# Patient Record
Sex: Female | Born: 1950 | Race: Black or African American | Hispanic: No | Marital: Single
Health system: Southern US, Community
[De-identification: ages and names within clinical notes are randomized; demographics above are authoritative.]

## PROBLEM LIST (undated history)

## (undated) HISTORY — PX: BREAST BIOPSY: SHX20

---

## 2000-01-05 ENCOUNTER — Encounter: Payer: Self-pay | Admitting: Family Medicine

## 2000-01-05 ENCOUNTER — Encounter: Admission: RE | Admit: 2000-01-05 | Discharge: 2000-01-05 | Payer: Self-pay | Admitting: Family Medicine

## 2000-05-15 ENCOUNTER — Other Ambulatory Visit: Admission: RE | Admit: 2000-05-15 | Discharge: 2000-05-15 | Payer: Self-pay | Admitting: Family Medicine

## 2001-01-05 ENCOUNTER — Encounter: Admission: RE | Admit: 2001-01-05 | Discharge: 2001-01-05 | Payer: Self-pay | Admitting: Family Medicine

## 2001-01-05 ENCOUNTER — Encounter: Payer: Self-pay | Admitting: Family Medicine

## 2002-07-03 ENCOUNTER — Encounter: Payer: Self-pay | Admitting: Family Medicine

## 2002-07-03 ENCOUNTER — Encounter: Admission: RE | Admit: 2002-07-03 | Discharge: 2002-07-03 | Payer: Self-pay | Admitting: Family Medicine

## 2003-08-06 ENCOUNTER — Ambulatory Visit (HOSPITAL_COMMUNITY): Admission: RE | Admit: 2003-08-06 | Discharge: 2003-08-06 | Payer: Self-pay | Admitting: Family Medicine

## 2003-08-08 ENCOUNTER — Encounter: Admission: RE | Admit: 2003-08-08 | Discharge: 2003-08-08 | Payer: Self-pay | Admitting: Family Medicine

## 2003-08-27 ENCOUNTER — Other Ambulatory Visit: Admission: RE | Admit: 2003-08-27 | Discharge: 2003-08-27 | Payer: Self-pay | Admitting: Family Medicine

## 2004-09-07 ENCOUNTER — Ambulatory Visit (HOSPITAL_COMMUNITY): Admission: RE | Admit: 2004-09-07 | Discharge: 2004-09-07 | Payer: Self-pay | Admitting: Family Medicine

## 2006-11-06 ENCOUNTER — Other Ambulatory Visit: Admission: RE | Admit: 2006-11-06 | Discharge: 2006-11-06 | Payer: Self-pay | Admitting: Family Medicine

## 2007-08-02 ENCOUNTER — Encounter: Admission: RE | Admit: 2007-08-02 | Discharge: 2007-08-02 | Payer: Self-pay | Admitting: Family Medicine

## 2007-12-13 ENCOUNTER — Encounter: Admission: RE | Admit: 2007-12-13 | Discharge: 2007-12-13 | Payer: Self-pay | Admitting: Family Medicine

## 2009-01-30 ENCOUNTER — Encounter: Admission: RE | Admit: 2009-01-30 | Discharge: 2009-01-30 | Payer: Self-pay | Admitting: Family Medicine

## 2009-02-09 ENCOUNTER — Encounter: Admission: RE | Admit: 2009-02-09 | Discharge: 2009-02-09 | Payer: Self-pay | Admitting: Family Medicine

## 2009-05-13 ENCOUNTER — Encounter: Admission: RE | Admit: 2009-05-13 | Discharge: 2009-05-13 | Payer: Self-pay | Admitting: Family Medicine

## 2010-05-17 ENCOUNTER — Encounter: Admission: RE | Admit: 2010-05-17 | Discharge: 2010-05-17 | Payer: Self-pay | Admitting: Family Medicine

## 2010-06-16 ENCOUNTER — Encounter
Admission: RE | Admit: 2010-06-16 | Discharge: 2010-06-16 | Payer: Self-pay | Source: Home / Self Care | Attending: Oral Surgery | Admitting: Oral Surgery

## 2011-08-04 ENCOUNTER — Other Ambulatory Visit: Payer: Self-pay | Admitting: Family Medicine

## 2011-08-04 DIAGNOSIS — Z1231 Encounter for screening mammogram for malignant neoplasm of breast: Secondary | ICD-10-CM

## 2011-08-11 ENCOUNTER — Ambulatory Visit
Admission: RE | Admit: 2011-08-11 | Discharge: 2011-08-11 | Disposition: A | Discharge status: Home / Self Care | Payer: Federal, State, Local not specified - PPO | Source: Ambulatory Visit | Attending: Family Medicine | Admitting: Family Medicine

## 2011-08-11 DIAGNOSIS — Z1231 Encounter for screening mammogram for malignant neoplasm of breast: Secondary | ICD-10-CM

## 2012-08-29 ENCOUNTER — Other Ambulatory Visit: Payer: Self-pay

## 2012-08-29 DIAGNOSIS — Z1231 Encounter for screening mammogram for malignant neoplasm of breast: Secondary | ICD-10-CM

## 2012-09-24 ENCOUNTER — Ambulatory Visit
Admission: RE | Admit: 2012-09-24 | Discharge: 2012-09-24 | Disposition: A | Discharge status: Home / Self Care | Payer: Federal, State, Local not specified - PPO | Source: Ambulatory Visit

## 2012-09-24 DIAGNOSIS — Z1231 Encounter for screening mammogram for malignant neoplasm of breast: Secondary | ICD-10-CM

## 2014-02-25 ENCOUNTER — Other Ambulatory Visit: Payer: Self-pay

## 2014-02-25 DIAGNOSIS — Z1231 Encounter for screening mammogram for malignant neoplasm of breast: Secondary | ICD-10-CM

## 2014-03-11 ENCOUNTER — Ambulatory Visit
Admission: RE | Admit: 2014-03-11 | Discharge: 2014-03-11 | Disposition: A | Discharge status: Home / Self Care | Payer: Federal, State, Local not specified - PPO | Source: Ambulatory Visit

## 2014-03-11 DIAGNOSIS — Z1231 Encounter for screening mammogram for malignant neoplasm of breast: Secondary | ICD-10-CM

## 2015-03-12 ENCOUNTER — Other Ambulatory Visit: Payer: Self-pay | Admitting: Family Medicine

## 2015-03-12 DIAGNOSIS — N6321 Unspecified lump in the left breast, upper outer quadrant: Secondary | ICD-10-CM

## 2015-03-12 DIAGNOSIS — N644 Mastodynia: Secondary | ICD-10-CM

## 2015-03-19 ENCOUNTER — Ambulatory Visit
Admission: RE | Admit: 2015-03-19 | Discharge: 2015-03-19 | Disposition: A | Discharge status: Home / Self Care | Payer: Federal, State, Local not specified - PPO | Source: Ambulatory Visit | Attending: Family Medicine | Admitting: Family Medicine

## 2015-03-19 ENCOUNTER — Other Ambulatory Visit: Payer: Self-pay | Admitting: Family Medicine

## 2015-03-19 DIAGNOSIS — N6320 Unspecified lump in the left breast, unspecified quadrant: Secondary | ICD-10-CM

## 2015-03-19 DIAGNOSIS — N6321 Unspecified lump in the left breast, upper outer quadrant: Secondary | ICD-10-CM

## 2015-03-19 DIAGNOSIS — N644 Mastodynia: Secondary | ICD-10-CM

## 2015-03-25 ENCOUNTER — Other Ambulatory Visit: Payer: Self-pay | Admitting: Family Medicine

## 2015-03-25 DIAGNOSIS — N6320 Unspecified lump in the left breast, unspecified quadrant: Secondary | ICD-10-CM

## 2015-03-25 DIAGNOSIS — N644 Mastodynia: Secondary | ICD-10-CM

## 2015-03-27 ENCOUNTER — Ambulatory Visit
Admission: RE | Admit: 2015-03-27 | Discharge: 2015-03-27 | Disposition: A | Discharge status: Home / Self Care | Payer: Federal, State, Local not specified - PPO | Source: Ambulatory Visit | Attending: Family Medicine | Admitting: Family Medicine

## 2015-03-27 DIAGNOSIS — N6320 Unspecified lump in the left breast, unspecified quadrant: Secondary | ICD-10-CM

## 2015-03-27 DIAGNOSIS — N644 Mastodynia: Secondary | ICD-10-CM

## 2015-09-08 DIAGNOSIS — I10 Essential (primary) hypertension: Secondary | ICD-10-CM | POA: Diagnosis not present

## 2015-09-08 DIAGNOSIS — Z23 Encounter for immunization: Secondary | ICD-10-CM | POA: Diagnosis not present

## 2015-09-08 DIAGNOSIS — J309 Allergic rhinitis, unspecified: Secondary | ICD-10-CM | POA: Diagnosis not present

## 2015-09-09 ENCOUNTER — Other Ambulatory Visit: Payer: Self-pay | Admitting: Family Medicine

## 2015-09-09 DIAGNOSIS — N6320 Unspecified lump in the left breast, unspecified quadrant: Secondary | ICD-10-CM

## 2015-09-28 ENCOUNTER — Ambulatory Visit
Admission: RE | Admit: 2015-09-28 | Discharge: 2015-09-28 | Disposition: A | Discharge status: Home / Self Care | Payer: Medicare Other | Source: Ambulatory Visit | Attending: Family Medicine | Admitting: Family Medicine

## 2015-09-28 DIAGNOSIS — N6320 Unspecified lump in the left breast, unspecified quadrant: Secondary | ICD-10-CM

## 2015-09-28 DIAGNOSIS — N6489 Other specified disorders of breast: Secondary | ICD-10-CM | POA: Diagnosis not present

## 2015-12-29 DIAGNOSIS — H04123 Dry eye syndrome of bilateral lacrimal glands: Secondary | ICD-10-CM | POA: Diagnosis not present

## 2016-02-01 DIAGNOSIS — F4323 Adjustment disorder with mixed anxiety and depressed mood: Secondary | ICD-10-CM | POA: Diagnosis not present

## 2016-03-18 ENCOUNTER — Other Ambulatory Visit: Payer: Self-pay | Admitting: Family Medicine

## 2016-03-18 DIAGNOSIS — N63 Unspecified lump in unspecified breast: Secondary | ICD-10-CM

## 2016-03-30 ENCOUNTER — Ambulatory Visit
Admission: RE | Admit: 2016-03-30 | Discharge: 2016-03-30 | Disposition: A | Discharge status: Home / Self Care | Payer: Medicare Other | Source: Ambulatory Visit | Attending: Family Medicine | Admitting: Family Medicine

## 2016-03-30 DIAGNOSIS — R928 Other abnormal and inconclusive findings on diagnostic imaging of breast: Secondary | ICD-10-CM | POA: Diagnosis not present

## 2016-03-30 DIAGNOSIS — N63 Unspecified lump in unspecified breast: Secondary | ICD-10-CM

## 2016-04-05 DIAGNOSIS — J309 Allergic rhinitis, unspecified: Secondary | ICD-10-CM | POA: Diagnosis not present

## 2016-04-05 DIAGNOSIS — Z0000 Encounter for general adult medical examination without abnormal findings: Secondary | ICD-10-CM | POA: Diagnosis not present

## 2016-04-05 DIAGNOSIS — Z780 Asymptomatic menopausal state: Secondary | ICD-10-CM | POA: Diagnosis not present

## 2016-04-05 DIAGNOSIS — I10 Essential (primary) hypertension: Secondary | ICD-10-CM | POA: Diagnosis not present

## 2016-04-05 DIAGNOSIS — E663 Overweight: Secondary | ICD-10-CM | POA: Diagnosis not present

## 2016-04-05 DIAGNOSIS — H6121 Impacted cerumen, right ear: Secondary | ICD-10-CM | POA: Diagnosis not present

## 2016-04-05 DIAGNOSIS — Z1159 Encounter for screening for other viral diseases: Secondary | ICD-10-CM | POA: Diagnosis not present

## 2016-05-05 DIAGNOSIS — E2839 Other primary ovarian failure: Secondary | ICD-10-CM | POA: Diagnosis not present

## 2016-11-10 DIAGNOSIS — K649 Unspecified hemorrhoids: Secondary | ICD-10-CM | POA: Diagnosis not present

## 2017-05-02 DIAGNOSIS — I10 Essential (primary) hypertension: Secondary | ICD-10-CM | POA: Diagnosis not present

## 2017-05-02 DIAGNOSIS — Z0000 Encounter for general adult medical examination without abnormal findings: Secondary | ICD-10-CM | POA: Diagnosis not present

## 2017-05-02 DIAGNOSIS — Z23 Encounter for immunization: Secondary | ICD-10-CM | POA: Diagnosis not present

## 2017-05-02 DIAGNOSIS — E785 Hyperlipidemia, unspecified: Secondary | ICD-10-CM | POA: Diagnosis not present

## 2017-05-02 DIAGNOSIS — Z1211 Encounter for screening for malignant neoplasm of colon: Secondary | ICD-10-CM | POA: Diagnosis not present

## 2017-05-02 DIAGNOSIS — J309 Allergic rhinitis, unspecified: Secondary | ICD-10-CM | POA: Diagnosis not present

## 2017-05-02 DIAGNOSIS — E663 Overweight: Secondary | ICD-10-CM | POA: Diagnosis not present

## 2017-05-02 DIAGNOSIS — M545 Low back pain: Secondary | ICD-10-CM | POA: Diagnosis not present

## 2017-05-08 ENCOUNTER — Other Ambulatory Visit: Payer: Self-pay | Admitting: Family Medicine

## 2017-05-08 DIAGNOSIS — Z139 Encounter for screening, unspecified: Secondary | ICD-10-CM

## 2017-05-15 ENCOUNTER — Other Ambulatory Visit: Payer: Self-pay | Admitting: Family Medicine

## 2017-05-15 ENCOUNTER — Ambulatory Visit
Admission: RE | Admit: 2017-05-15 | Discharge: 2017-05-15 | Disposition: A | Discharge status: Home / Self Care | Payer: Medicare Other | Source: Ambulatory Visit | Attending: Family Medicine | Admitting: Family Medicine

## 2017-05-15 DIAGNOSIS — M5489 Other dorsalgia: Secondary | ICD-10-CM

## 2017-05-15 DIAGNOSIS — M533 Sacrococcygeal disorders, not elsewhere classified: Secondary | ICD-10-CM | POA: Diagnosis not present

## 2017-05-15 DIAGNOSIS — M47816 Spondylosis without myelopathy or radiculopathy, lumbar region: Secondary | ICD-10-CM | POA: Diagnosis not present

## 2017-05-15 DIAGNOSIS — R102 Pelvic and perineal pain: Secondary | ICD-10-CM | POA: Diagnosis not present

## 2017-06-07 ENCOUNTER — Ambulatory Visit
Admission: RE | Admit: 2017-06-07 | Discharge: 2017-06-07 | Disposition: A | Discharge status: Home / Self Care | Payer: Medicare Other | Source: Ambulatory Visit | Attending: Family Medicine | Admitting: Family Medicine

## 2017-06-07 ENCOUNTER — Encounter: Payer: Self-pay | Admitting: Radiology

## 2017-06-07 DIAGNOSIS — Z139 Encounter for screening, unspecified: Secondary | ICD-10-CM

## 2017-06-07 DIAGNOSIS — Z1231 Encounter for screening mammogram for malignant neoplasm of breast: Secondary | ICD-10-CM | POA: Diagnosis not present

## 2017-06-16 DIAGNOSIS — H2513 Age-related nuclear cataract, bilateral: Secondary | ICD-10-CM | POA: Diagnosis not present

## 2017-06-16 DIAGNOSIS — M545 Low back pain: Secondary | ICD-10-CM | POA: Diagnosis not present

## 2017-06-22 DIAGNOSIS — M545 Low back pain: Secondary | ICD-10-CM | POA: Diagnosis not present

## 2017-06-23 DIAGNOSIS — M545 Low back pain: Secondary | ICD-10-CM | POA: Diagnosis not present

## 2017-06-26 DIAGNOSIS — M545 Low back pain: Secondary | ICD-10-CM | POA: Diagnosis not present

## 2017-06-30 DIAGNOSIS — M545 Low back pain: Secondary | ICD-10-CM | POA: Diagnosis not present

## 2017-07-05 DIAGNOSIS — M545 Low back pain: Secondary | ICD-10-CM | POA: Diagnosis not present

## 2017-07-07 DIAGNOSIS — M545 Low back pain: Secondary | ICD-10-CM | POA: Diagnosis not present

## 2017-07-12 DIAGNOSIS — M545 Low back pain: Secondary | ICD-10-CM | POA: Diagnosis not present

## 2017-07-14 DIAGNOSIS — M545 Low back pain: Secondary | ICD-10-CM | POA: Diagnosis not present

## 2017-07-19 DIAGNOSIS — M545 Low back pain: Secondary | ICD-10-CM | POA: Diagnosis not present

## 2017-07-21 DIAGNOSIS — M545 Low back pain: Secondary | ICD-10-CM | POA: Diagnosis not present

## 2017-07-25 DIAGNOSIS — Z1211 Encounter for screening for malignant neoplasm of colon: Secondary | ICD-10-CM | POA: Diagnosis not present

## 2017-07-25 DIAGNOSIS — K5730 Diverticulosis of large intestine without perforation or abscess without bleeding: Secondary | ICD-10-CM | POA: Diagnosis not present

## 2017-07-28 DIAGNOSIS — M545 Low back pain: Secondary | ICD-10-CM | POA: Diagnosis not present

## 2017-08-01 DIAGNOSIS — M545 Low back pain: Secondary | ICD-10-CM | POA: Diagnosis not present

## 2017-11-03 DIAGNOSIS — I10 Essential (primary) hypertension: Secondary | ICD-10-CM | POA: Diagnosis not present

## 2017-11-03 DIAGNOSIS — E663 Overweight: Secondary | ICD-10-CM | POA: Diagnosis not present

## 2017-11-03 DIAGNOSIS — E785 Hyperlipidemia, unspecified: Secondary | ICD-10-CM | POA: Diagnosis not present

## 2018-01-15 DIAGNOSIS — H15102 Unspecified episcleritis, left eye: Secondary | ICD-10-CM | POA: Diagnosis not present

## 2018-02-13 DIAGNOSIS — H1013 Acute atopic conjunctivitis, bilateral: Secondary | ICD-10-CM | POA: Diagnosis not present

## 2018-05-09 ENCOUNTER — Other Ambulatory Visit: Payer: Self-pay | Admitting: Family Medicine

## 2018-05-09 DIAGNOSIS — Z1231 Encounter for screening mammogram for malignant neoplasm of breast: Secondary | ICD-10-CM

## 2018-05-21 DIAGNOSIS — M791 Myalgia, unspecified site: Secondary | ICD-10-CM | POA: Diagnosis not present

## 2018-05-21 DIAGNOSIS — E785 Hyperlipidemia, unspecified: Secondary | ICD-10-CM | POA: Diagnosis not present

## 2018-05-21 DIAGNOSIS — M47896 Other spondylosis, lumbar region: Secondary | ICD-10-CM | POA: Diagnosis not present

## 2018-05-21 DIAGNOSIS — L308 Other specified dermatitis: Secondary | ICD-10-CM | POA: Diagnosis not present

## 2018-05-21 DIAGNOSIS — Z0000 Encounter for general adult medical examination without abnormal findings: Secondary | ICD-10-CM | POA: Diagnosis not present

## 2018-05-21 DIAGNOSIS — Z1389 Encounter for screening for other disorder: Secondary | ICD-10-CM | POA: Diagnosis not present

## 2018-05-21 DIAGNOSIS — M545 Low back pain: Secondary | ICD-10-CM | POA: Diagnosis not present

## 2018-05-21 DIAGNOSIS — E663 Overweight: Secondary | ICD-10-CM | POA: Diagnosis not present

## 2018-05-21 DIAGNOSIS — J309 Allergic rhinitis, unspecified: Secondary | ICD-10-CM | POA: Diagnosis not present

## 2018-05-21 DIAGNOSIS — I10 Essential (primary) hypertension: Secondary | ICD-10-CM | POA: Diagnosis not present

## 2018-05-23 DIAGNOSIS — I10 Essential (primary) hypertension: Secondary | ICD-10-CM | POA: Diagnosis not present

## 2018-05-23 DIAGNOSIS — E559 Vitamin D deficiency, unspecified: Secondary | ICD-10-CM | POA: Diagnosis not present

## 2018-05-23 DIAGNOSIS — M791 Myalgia, unspecified site: Secondary | ICD-10-CM | POA: Diagnosis not present

## 2018-05-23 DIAGNOSIS — E663 Overweight: Secondary | ICD-10-CM | POA: Diagnosis not present

## 2018-05-23 DIAGNOSIS — L308 Other specified dermatitis: Secondary | ICD-10-CM | POA: Diagnosis not present

## 2018-05-23 DIAGNOSIS — E785 Hyperlipidemia, unspecified: Secondary | ICD-10-CM | POA: Diagnosis not present

## 2018-05-23 DIAGNOSIS — M545 Low back pain: Secondary | ICD-10-CM | POA: Diagnosis not present

## 2018-05-23 DIAGNOSIS — J309 Allergic rhinitis, unspecified: Secondary | ICD-10-CM | POA: Diagnosis not present

## 2018-06-26 ENCOUNTER — Ambulatory Visit
Admission: RE | Admit: 2018-06-26 | Discharge: 2018-06-26 | Disposition: A | Discharge status: Home / Self Care | Payer: Medicare Other | Source: Ambulatory Visit | Attending: Family Medicine | Admitting: Family Medicine

## 2018-06-26 DIAGNOSIS — Z1231 Encounter for screening mammogram for malignant neoplasm of breast: Secondary | ICD-10-CM | POA: Diagnosis not present

## 2018-06-27 ENCOUNTER — Ambulatory Visit: Payer: Medicare Other

## 2018-07-24 DIAGNOSIS — H2513 Age-related nuclear cataract, bilateral: Secondary | ICD-10-CM | POA: Diagnosis not present

## 2018-08-08 IMAGING — CR DG SACRUM/COCCYX 2+V
3 series · 3 of 3 positions shown · non-contrast
Comparison: None.

CLINICAL DATA: 65-year-old female with low back and bilateral
sacroiliac joint pain for the past year. No injury. Initial
encounter.

EXAM:
SACRUM AND COCCYX - 2+ VIEW

[t sacrum a.p.]
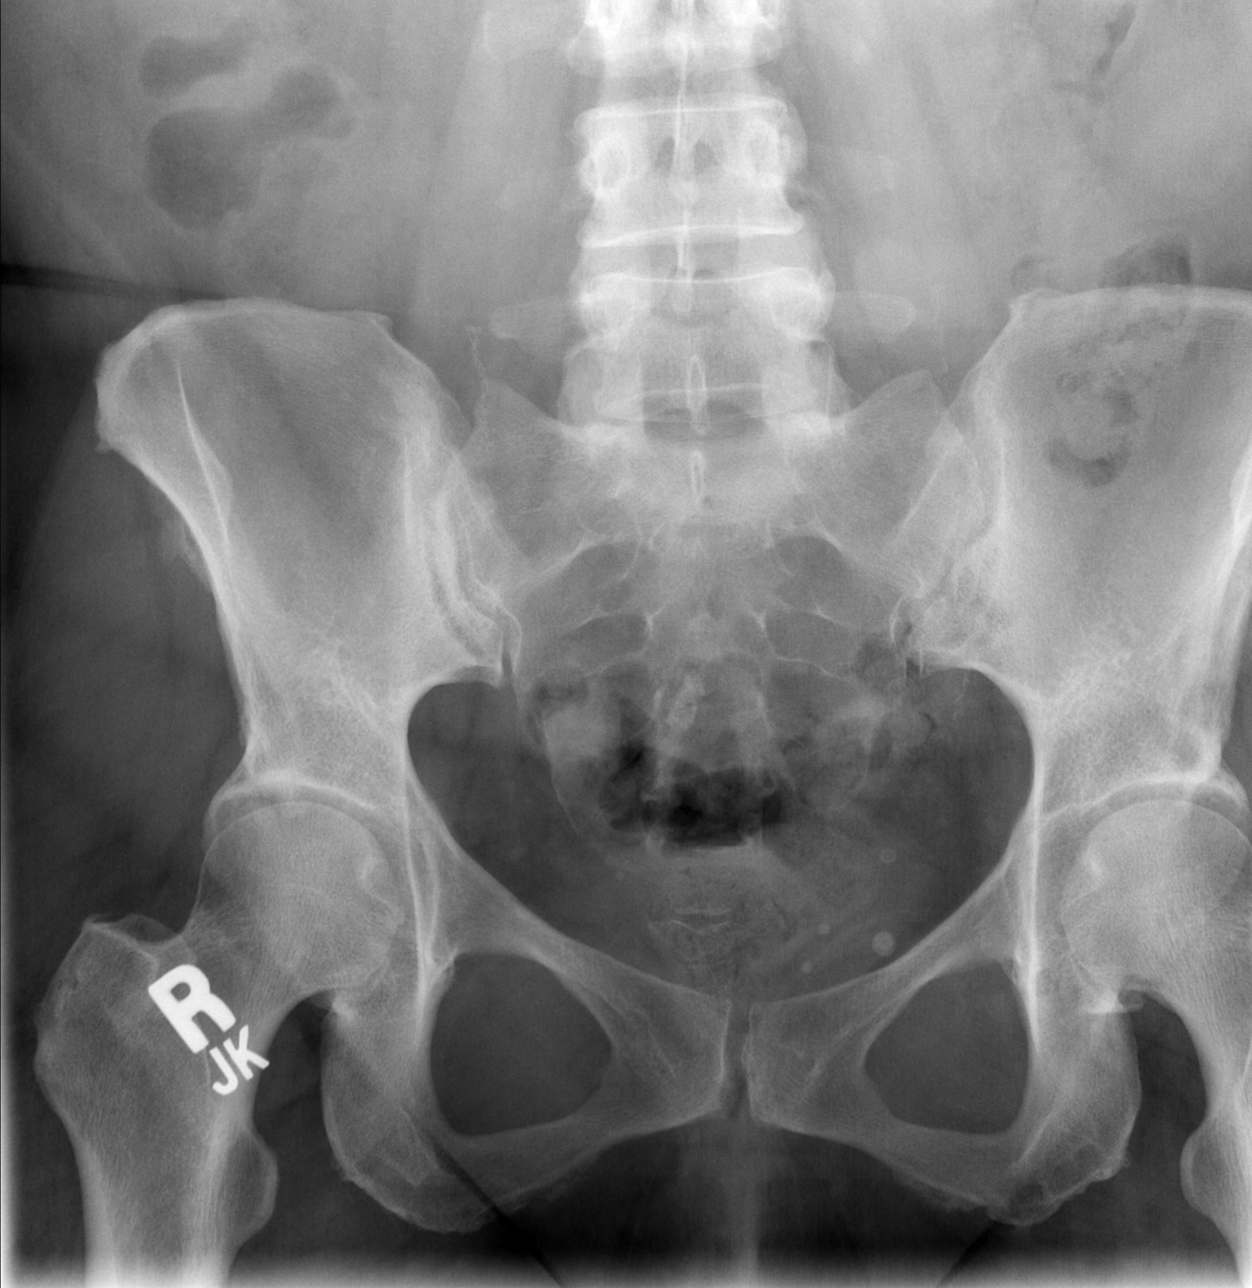

[t coccyx a.p.]
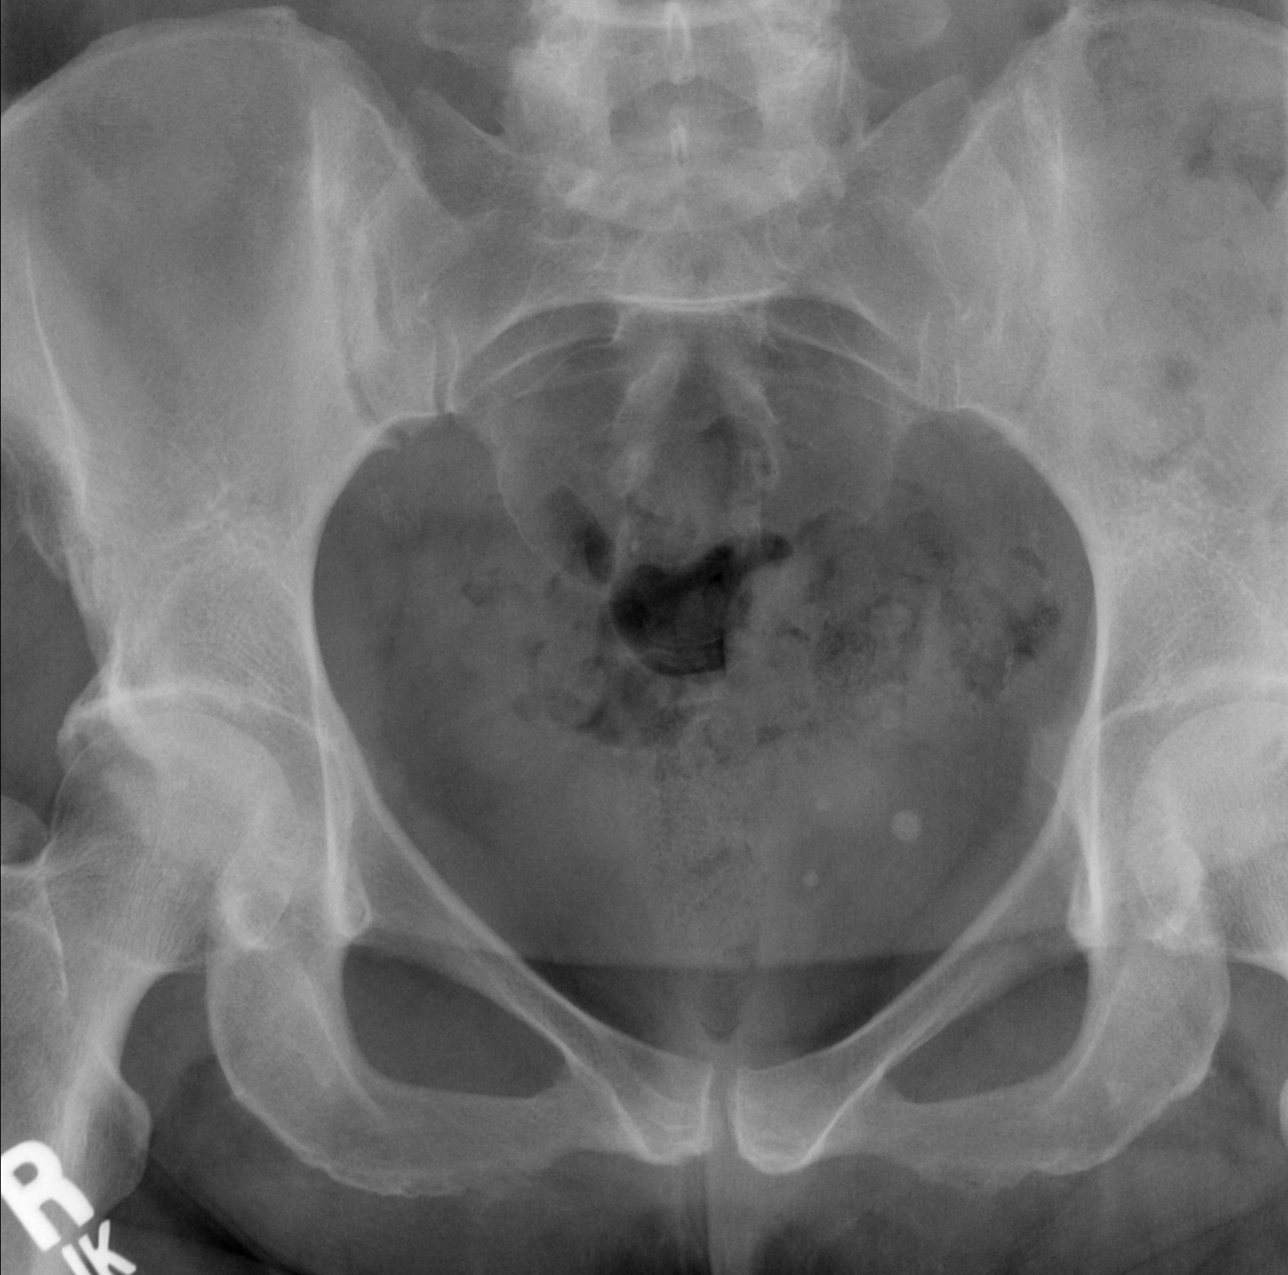

[t sacrum lat]
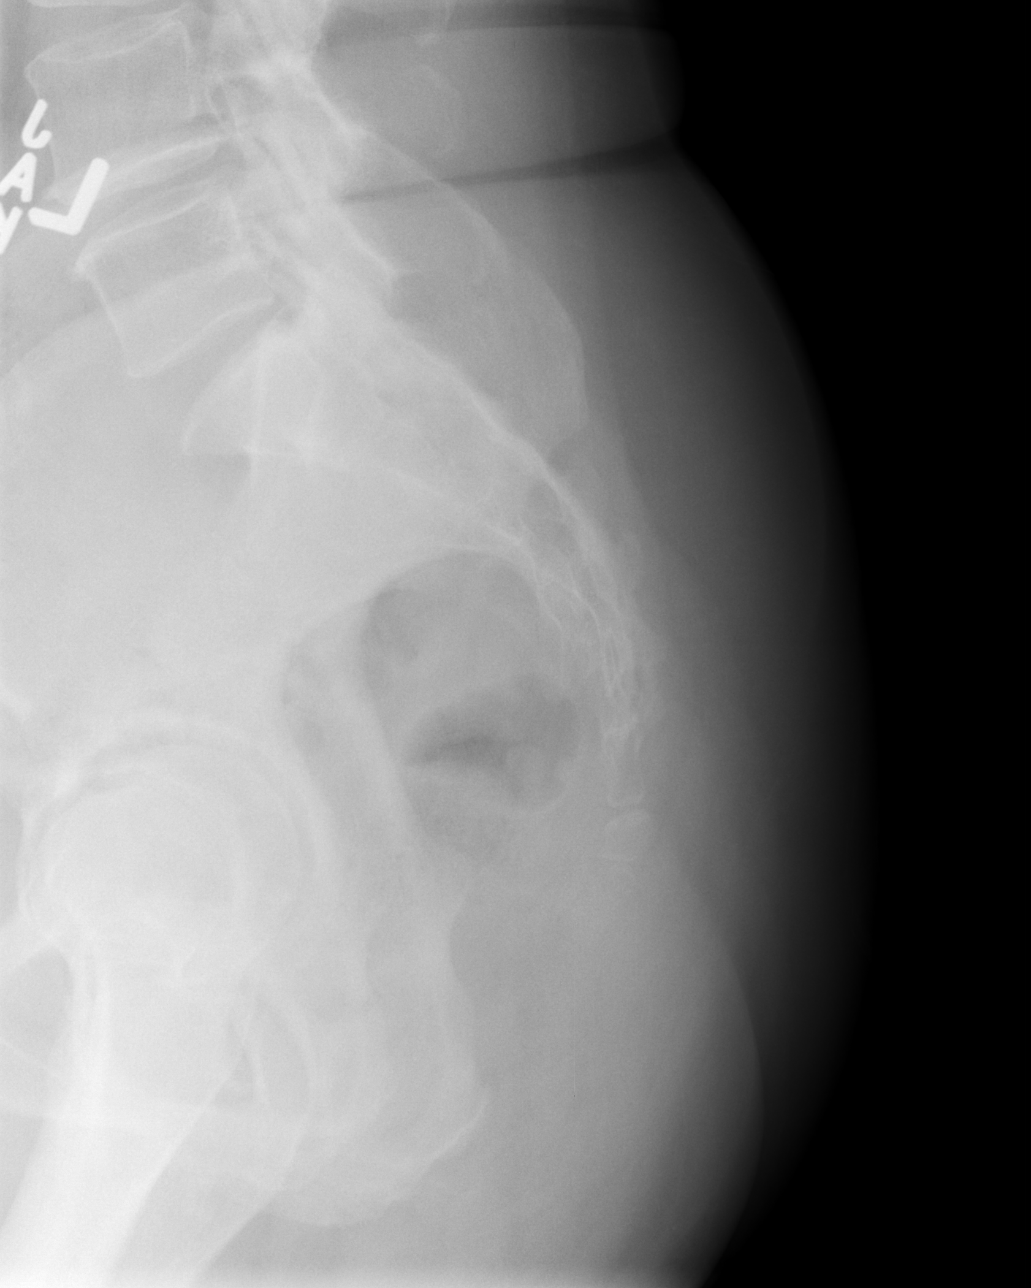

[3 of 3 positions shown; findings below may reference images not displayed]

FINDINGS: No sacroiliac joint erosion or sclerosis.
IMPRESSION: Negative.

## 2018-08-08 IMAGING — CR DG PELVIS 1-2V
1 series · 1 of 1 positions shown · non-contrast
Comparison: None.

CLINICAL DATA: 65-year-old female with low back and bilateral
sacroiliac joint pain for the past year. No injury. Initial
encounter.

EXAM:
PELVIS - 1-2 VIEW

[t pelvis a.p.]
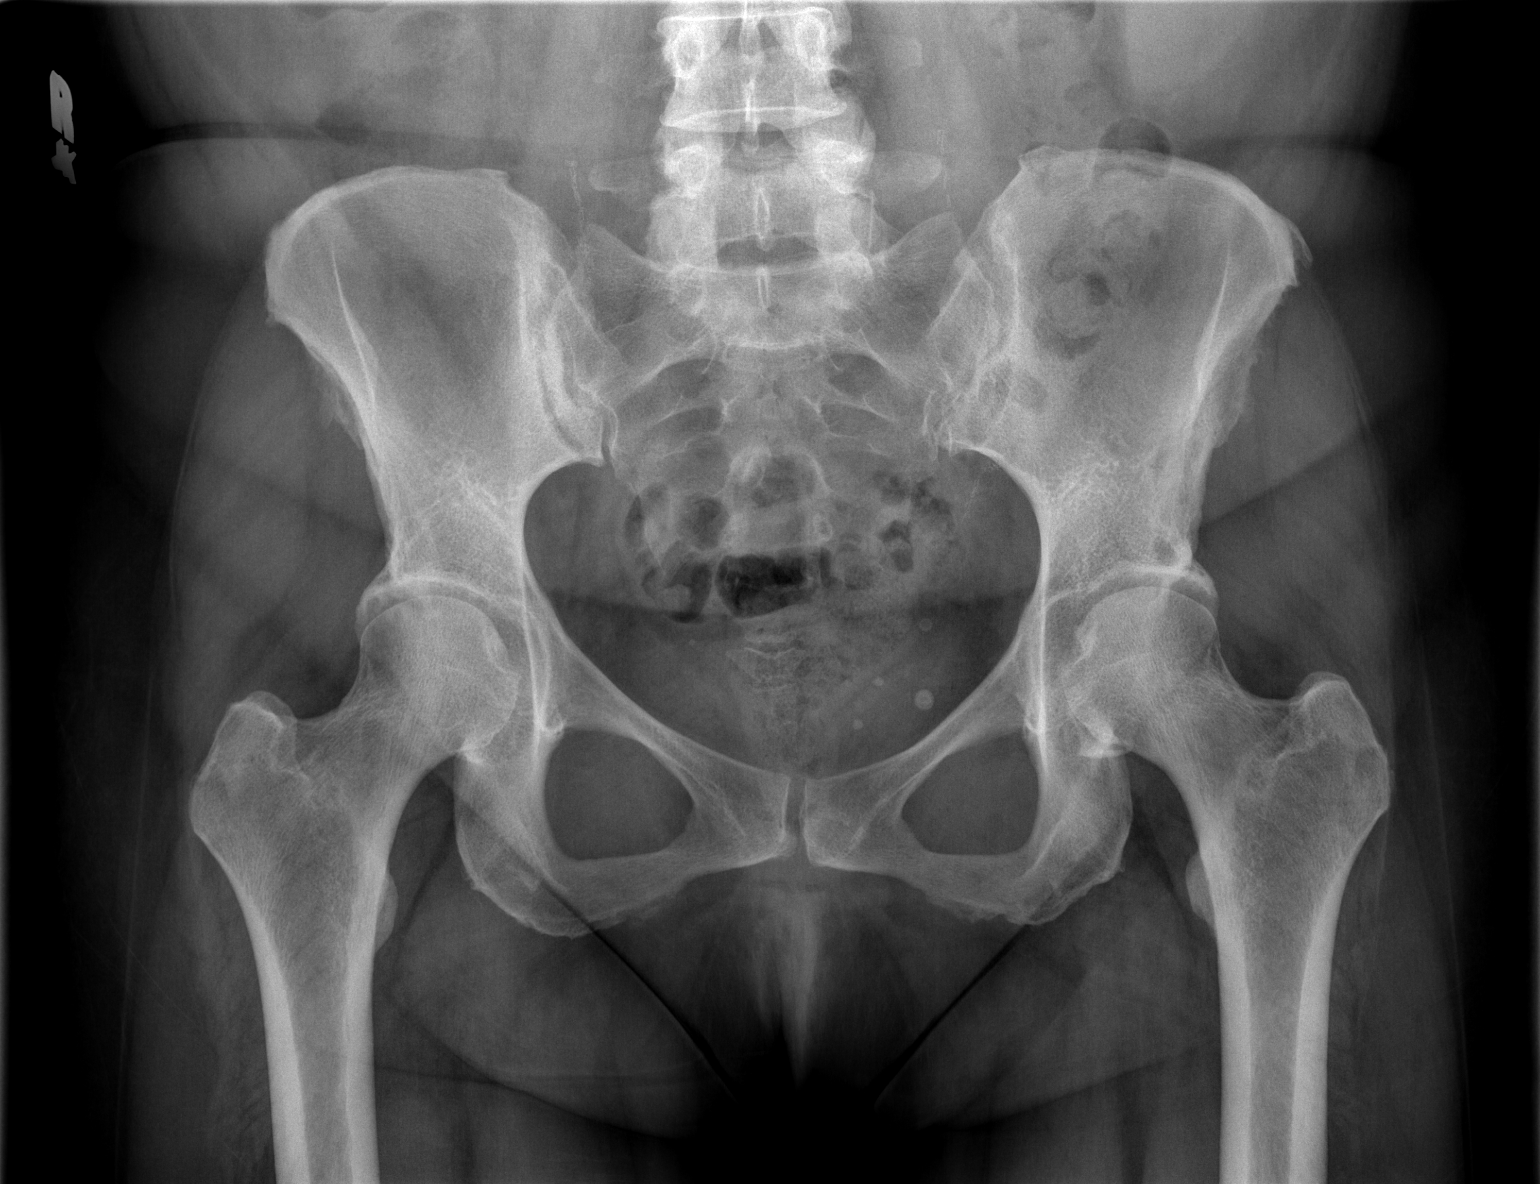

[1 of 1 positions shown; findings below may reference images not displayed]

FINDINGS: No significant degenerative changes.  No osseous abnormality.
IMPRESSION: Negative.

## 2018-11-26 DIAGNOSIS — E785 Hyperlipidemia, unspecified: Secondary | ICD-10-CM | POA: Diagnosis not present

## 2018-11-26 DIAGNOSIS — I10 Essential (primary) hypertension: Secondary | ICD-10-CM | POA: Diagnosis not present

## 2018-12-11 ENCOUNTER — Other Ambulatory Visit: Payer: Self-pay | Admitting: *Deleted

## 2018-12-11 DIAGNOSIS — Z20822 Contact with and (suspected) exposure to covid-19: Secondary | ICD-10-CM

## 2018-12-11 DIAGNOSIS — R6889 Other general symptoms and signs: Secondary | ICD-10-CM | POA: Diagnosis not present

## 2018-12-14 LAB — NOVEL CORONAVIRUS, NAA: SARS-CoV-2, NAA: NOT DETECTED

## 2018-12-18 ENCOUNTER — Telehealth: Payer: Self-pay

## 2018-12-18 NOTE — Telephone Encounter (Signed)
Pt informed of negative covid 19 results. Discussed signs and sx of coronavirus. Pt instructed to call 911 for any worsening resp. Sx or distress. Pt verbalized understanding.

## 2019-05-22 ENCOUNTER — Other Ambulatory Visit: Payer: Self-pay | Admitting: Family Medicine

## 2019-05-27 DIAGNOSIS — E785 Hyperlipidemia, unspecified: Secondary | ICD-10-CM | POA: Diagnosis not present

## 2019-05-27 DIAGNOSIS — Z23 Encounter for immunization: Secondary | ICD-10-CM | POA: Diagnosis not present

## 2019-05-28 ENCOUNTER — Other Ambulatory Visit: Payer: Self-pay | Admitting: Family Medicine

## 2019-05-28 DIAGNOSIS — I10 Essential (primary) hypertension: Secondary | ICD-10-CM | POA: Diagnosis not present

## 2019-05-28 DIAGNOSIS — E663 Overweight: Secondary | ICD-10-CM | POA: Diagnosis not present

## 2019-05-28 DIAGNOSIS — N644 Mastodynia: Secondary | ICD-10-CM | POA: Diagnosis not present

## 2019-05-28 DIAGNOSIS — R739 Hyperglycemia, unspecified: Secondary | ICD-10-CM | POA: Diagnosis not present

## 2019-05-28 DIAGNOSIS — E785 Hyperlipidemia, unspecified: Secondary | ICD-10-CM | POA: Diagnosis not present

## 2019-07-03 ENCOUNTER — Ambulatory Visit
Admission: RE | Admit: 2019-07-03 | Discharge: 2019-07-03 | Disposition: A | Discharge status: Home / Self Care | Payer: Medicare Other | Source: Ambulatory Visit | Attending: Family Medicine | Admitting: Family Medicine

## 2019-07-03 ENCOUNTER — Other Ambulatory Visit: Payer: Self-pay

## 2019-07-03 DIAGNOSIS — N644 Mastodynia: Secondary | ICD-10-CM

## 2019-07-12 DIAGNOSIS — M5441 Lumbago with sciatica, right side: Secondary | ICD-10-CM | POA: Diagnosis not present

## 2019-07-12 DIAGNOSIS — M5442 Lumbago with sciatica, left side: Secondary | ICD-10-CM | POA: Diagnosis not present

## 2019-08-26 DIAGNOSIS — H5211 Myopia, right eye: Secondary | ICD-10-CM | POA: Diagnosis not present

## 2019-08-26 DIAGNOSIS — H25041 Posterior subcapsular polar age-related cataract, right eye: Secondary | ICD-10-CM | POA: Diagnosis not present

## 2019-12-06 DIAGNOSIS — I10 Essential (primary) hypertension: Secondary | ICD-10-CM | POA: Diagnosis not present

## 2019-12-06 DIAGNOSIS — M47896 Other spondylosis, lumbar region: Secondary | ICD-10-CM | POA: Diagnosis not present

## 2019-12-06 DIAGNOSIS — E785 Hyperlipidemia, unspecified: Secondary | ICD-10-CM | POA: Diagnosis not present

## 2020-01-29 DIAGNOSIS — M47896 Other spondylosis, lumbar region: Secondary | ICD-10-CM | POA: Diagnosis not present

## 2020-01-29 DIAGNOSIS — E663 Overweight: Secondary | ICD-10-CM | POA: Diagnosis not present

## 2020-01-29 DIAGNOSIS — Z6829 Body mass index (BMI) 29.0-29.9, adult: Secondary | ICD-10-CM | POA: Diagnosis not present

## 2020-01-29 DIAGNOSIS — E785 Hyperlipidemia, unspecified: Secondary | ICD-10-CM | POA: Diagnosis not present

## 2020-01-29 DIAGNOSIS — N6310 Unspecified lump in the right breast, unspecified quadrant: Secondary | ICD-10-CM | POA: Diagnosis not present

## 2020-01-29 DIAGNOSIS — N6320 Unspecified lump in the left breast, unspecified quadrant: Secondary | ICD-10-CM | POA: Diagnosis not present

## 2020-01-29 DIAGNOSIS — I10 Essential (primary) hypertension: Secondary | ICD-10-CM | POA: Diagnosis not present

## 2020-01-29 DIAGNOSIS — J309 Allergic rhinitis, unspecified: Secondary | ICD-10-CM | POA: Diagnosis not present

## 2020-01-30 ENCOUNTER — Other Ambulatory Visit: Payer: Self-pay | Admitting: Family Medicine

## 2020-01-30 DIAGNOSIS — N63 Unspecified lump in unspecified breast: Secondary | ICD-10-CM

## 2020-02-10 DIAGNOSIS — E785 Hyperlipidemia, unspecified: Secondary | ICD-10-CM | POA: Diagnosis not present

## 2020-02-10 DIAGNOSIS — I10 Essential (primary) hypertension: Secondary | ICD-10-CM | POA: Diagnosis not present

## 2020-02-10 DIAGNOSIS — M47896 Other spondylosis, lumbar region: Secondary | ICD-10-CM | POA: Diagnosis not present

## 2020-02-14 ENCOUNTER — Ambulatory Visit: Payer: Medicare Other

## 2020-02-14 ENCOUNTER — Other Ambulatory Visit: Payer: Self-pay

## 2020-02-14 ENCOUNTER — Ambulatory Visit
Admission: RE | Admit: 2020-02-14 | Discharge: 2020-02-14 | Disposition: A | Discharge status: Home / Self Care | Payer: Medicare Other | Source: Ambulatory Visit | Attending: Family Medicine | Admitting: Family Medicine

## 2020-02-14 DIAGNOSIS — N63 Unspecified lump in unspecified breast: Secondary | ICD-10-CM

## 2020-02-14 DIAGNOSIS — R928 Other abnormal and inconclusive findings on diagnostic imaging of breast: Secondary | ICD-10-CM | POA: Diagnosis not present

## 2020-02-28 DIAGNOSIS — M47896 Other spondylosis, lumbar region: Secondary | ICD-10-CM | POA: Diagnosis not present

## 2020-02-28 DIAGNOSIS — E785 Hyperlipidemia, unspecified: Secondary | ICD-10-CM | POA: Diagnosis not present

## 2020-02-28 DIAGNOSIS — I10 Essential (primary) hypertension: Secondary | ICD-10-CM | POA: Diagnosis not present

## 2020-03-23 DIAGNOSIS — Z23 Encounter for immunization: Secondary | ICD-10-CM | POA: Diagnosis not present

## 2020-06-05 DIAGNOSIS — I10 Essential (primary) hypertension: Secondary | ICD-10-CM | POA: Diagnosis not present

## 2020-06-05 DIAGNOSIS — E785 Hyperlipidemia, unspecified: Secondary | ICD-10-CM | POA: Diagnosis not present

## 2020-06-05 DIAGNOSIS — M47896 Other spondylosis, lumbar region: Secondary | ICD-10-CM | POA: Diagnosis not present

## 2020-06-09 DIAGNOSIS — Z6829 Body mass index (BMI) 29.0-29.9, adult: Secondary | ICD-10-CM | POA: Diagnosis not present

## 2020-06-09 DIAGNOSIS — Z0000 Encounter for general adult medical examination without abnormal findings: Secondary | ICD-10-CM | POA: Diagnosis not present

## 2020-06-09 DIAGNOSIS — E663 Overweight: Secondary | ICD-10-CM | POA: Diagnosis not present

## 2020-06-09 DIAGNOSIS — H538 Other visual disturbances: Secondary | ICD-10-CM | POA: Diagnosis not present

## 2020-06-09 DIAGNOSIS — R739 Hyperglycemia, unspecified: Secondary | ICD-10-CM | POA: Diagnosis not present

## 2020-06-19 DIAGNOSIS — Z03818 Encounter for observation for suspected exposure to other biological agents ruled out: Secondary | ICD-10-CM | POA: Diagnosis not present

## 2020-06-19 DIAGNOSIS — Z20828 Contact with and (suspected) exposure to other viral communicable diseases: Secondary | ICD-10-CM | POA: Diagnosis not present

## 2020-08-17 DIAGNOSIS — E785 Hyperlipidemia, unspecified: Secondary | ICD-10-CM | POA: Diagnosis not present

## 2020-08-17 DIAGNOSIS — I10 Essential (primary) hypertension: Secondary | ICD-10-CM | POA: Diagnosis not present

## 2020-08-17 DIAGNOSIS — M47896 Other spondylosis, lumbar region: Secondary | ICD-10-CM | POA: Diagnosis not present

## 2020-10-15 DIAGNOSIS — I10 Essential (primary) hypertension: Secondary | ICD-10-CM | POA: Diagnosis not present

## 2020-10-15 DIAGNOSIS — E785 Hyperlipidemia, unspecified: Secondary | ICD-10-CM | POA: Diagnosis not present

## 2020-10-15 DIAGNOSIS — M47896 Other spondylosis, lumbar region: Secondary | ICD-10-CM | POA: Diagnosis not present

## 2020-11-27 DIAGNOSIS — E785 Hyperlipidemia, unspecified: Secondary | ICD-10-CM | POA: Diagnosis not present

## 2020-11-27 DIAGNOSIS — M47896 Other spondylosis, lumbar region: Secondary | ICD-10-CM | POA: Diagnosis not present

## 2020-11-27 DIAGNOSIS — N1831 Chronic kidney disease, stage 3a: Secondary | ICD-10-CM | POA: Diagnosis not present

## 2020-11-27 DIAGNOSIS — I129 Hypertensive chronic kidney disease with stage 1 through stage 4 chronic kidney disease, or unspecified chronic kidney disease: Secondary | ICD-10-CM | POA: Diagnosis not present

## 2020-12-08 DIAGNOSIS — H538 Other visual disturbances: Secondary | ICD-10-CM | POA: Diagnosis not present

## 2020-12-08 DIAGNOSIS — E785 Hyperlipidemia, unspecified: Secondary | ICD-10-CM | POA: Diagnosis not present

## 2020-12-08 DIAGNOSIS — I10 Essential (primary) hypertension: Secondary | ICD-10-CM | POA: Diagnosis not present

## 2020-12-08 DIAGNOSIS — R7303 Prediabetes: Secondary | ICD-10-CM | POA: Diagnosis not present

## 2020-12-08 DIAGNOSIS — Z0000 Encounter for general adult medical examination without abnormal findings: Secondary | ICD-10-CM | POA: Diagnosis not present

## 2020-12-08 DIAGNOSIS — Z20828 Contact with and (suspected) exposure to other viral communicable diseases: Secondary | ICD-10-CM | POA: Diagnosis not present

## 2020-12-08 DIAGNOSIS — Z6829 Body mass index (BMI) 29.0-29.9, adult: Secondary | ICD-10-CM | POA: Diagnosis not present

## 2020-12-08 DIAGNOSIS — E663 Overweight: Secondary | ICD-10-CM | POA: Diagnosis not present

## 2020-12-15 DIAGNOSIS — L309 Dermatitis, unspecified: Secondary | ICD-10-CM | POA: Diagnosis not present

## 2020-12-15 DIAGNOSIS — R7303 Prediabetes: Secondary | ICD-10-CM | POA: Diagnosis not present

## 2020-12-15 DIAGNOSIS — E785 Hyperlipidemia, unspecified: Secondary | ICD-10-CM | POA: Diagnosis not present

## 2020-12-15 DIAGNOSIS — N1831 Chronic kidney disease, stage 3a: Secondary | ICD-10-CM | POA: Diagnosis not present

## 2020-12-15 DIAGNOSIS — Z6828 Body mass index (BMI) 28.0-28.9, adult: Secondary | ICD-10-CM | POA: Diagnosis not present

## 2020-12-15 DIAGNOSIS — I129 Hypertensive chronic kidney disease with stage 1 through stage 4 chronic kidney disease, or unspecified chronic kidney disease: Secondary | ICD-10-CM | POA: Diagnosis not present

## 2020-12-15 DIAGNOSIS — Z0100 Encounter for examination of eyes and vision without abnormal findings: Secondary | ICD-10-CM | POA: Diagnosis not present

## 2021-01-25 ENCOUNTER — Other Ambulatory Visit: Payer: Self-pay | Admitting: Family Medicine

## 2021-01-25 DIAGNOSIS — Z1231 Encounter for screening mammogram for malignant neoplasm of breast: Secondary | ICD-10-CM

## 2021-03-01 DIAGNOSIS — M47896 Other spondylosis, lumbar region: Secondary | ICD-10-CM | POA: Diagnosis not present

## 2021-03-01 DIAGNOSIS — N1831 Chronic kidney disease, stage 3a: Secondary | ICD-10-CM | POA: Diagnosis not present

## 2021-03-01 DIAGNOSIS — E785 Hyperlipidemia, unspecified: Secondary | ICD-10-CM | POA: Diagnosis not present

## 2021-03-01 DIAGNOSIS — I129 Hypertensive chronic kidney disease with stage 1 through stage 4 chronic kidney disease, or unspecified chronic kidney disease: Secondary | ICD-10-CM | POA: Diagnosis not present

## 2021-03-03 DIAGNOSIS — E119 Type 2 diabetes mellitus without complications: Secondary | ICD-10-CM | POA: Diagnosis not present

## 2021-03-03 DIAGNOSIS — H43811 Vitreous degeneration, right eye: Secondary | ICD-10-CM | POA: Diagnosis not present

## 2021-03-03 DIAGNOSIS — H52203 Unspecified astigmatism, bilateral: Secondary | ICD-10-CM | POA: Diagnosis not present

## 2021-03-17 ENCOUNTER — Ambulatory Visit
Admission: RE | Admit: 2021-03-17 | Discharge: 2021-03-17 | Disposition: A | Discharge status: Home / Self Care | Payer: Medicare Other | Source: Ambulatory Visit | Attending: Family Medicine | Admitting: Family Medicine

## 2021-03-17 ENCOUNTER — Other Ambulatory Visit: Payer: Self-pay

## 2021-03-17 DIAGNOSIS — Z1231 Encounter for screening mammogram for malignant neoplasm of breast: Secondary | ICD-10-CM

## 2021-05-28 DIAGNOSIS — M47896 Other spondylosis, lumbar region: Secondary | ICD-10-CM | POA: Diagnosis not present

## 2021-05-28 DIAGNOSIS — I129 Hypertensive chronic kidney disease with stage 1 through stage 4 chronic kidney disease, or unspecified chronic kidney disease: Secondary | ICD-10-CM | POA: Diagnosis not present

## 2021-05-28 DIAGNOSIS — E785 Hyperlipidemia, unspecified: Secondary | ICD-10-CM | POA: Diagnosis not present

## 2021-05-28 DIAGNOSIS — N1831 Chronic kidney disease, stage 3a: Secondary | ICD-10-CM | POA: Diagnosis not present

## 2021-06-11 DIAGNOSIS — Z1389 Encounter for screening for other disorder: Secondary | ICD-10-CM | POA: Diagnosis not present

## 2021-06-11 DIAGNOSIS — Z0000 Encounter for general adult medical examination without abnormal findings: Secondary | ICD-10-CM | POA: Diagnosis not present

## 2021-06-16 DIAGNOSIS — R7303 Prediabetes: Secondary | ICD-10-CM | POA: Diagnosis not present

## 2021-06-16 DIAGNOSIS — I129 Hypertensive chronic kidney disease with stage 1 through stage 4 chronic kidney disease, or unspecified chronic kidney disease: Secondary | ICD-10-CM | POA: Diagnosis not present

## 2021-06-16 DIAGNOSIS — E785 Hyperlipidemia, unspecified: Secondary | ICD-10-CM | POA: Diagnosis not present

## 2021-06-23 DIAGNOSIS — E663 Overweight: Secondary | ICD-10-CM | POA: Diagnosis not present

## 2021-06-23 DIAGNOSIS — E785 Hyperlipidemia, unspecified: Secondary | ICD-10-CM | POA: Diagnosis not present

## 2021-06-23 DIAGNOSIS — J309 Allergic rhinitis, unspecified: Secondary | ICD-10-CM | POA: Diagnosis not present

## 2021-06-23 DIAGNOSIS — M47896 Other spondylosis, lumbar region: Secondary | ICD-10-CM | POA: Diagnosis not present

## 2021-06-23 DIAGNOSIS — I129 Hypertensive chronic kidney disease with stage 1 through stage 4 chronic kidney disease, or unspecified chronic kidney disease: Secondary | ICD-10-CM | POA: Diagnosis not present

## 2021-06-23 DIAGNOSIS — L309 Dermatitis, unspecified: Secondary | ICD-10-CM | POA: Diagnosis not present

## 2021-06-23 DIAGNOSIS — R7303 Prediabetes: Secondary | ICD-10-CM | POA: Diagnosis not present

## 2021-06-23 DIAGNOSIS — N1831 Chronic kidney disease, stage 3a: Secondary | ICD-10-CM | POA: Diagnosis not present

## 2021-12-29 DIAGNOSIS — I129 Hypertensive chronic kidney disease with stage 1 through stage 4 chronic kidney disease, or unspecified chronic kidney disease: Secondary | ICD-10-CM | POA: Diagnosis not present

## 2021-12-29 DIAGNOSIS — N1831 Chronic kidney disease, stage 3a: Secondary | ICD-10-CM | POA: Diagnosis not present

## 2021-12-29 DIAGNOSIS — E663 Overweight: Secondary | ICD-10-CM | POA: Diagnosis not present

## 2021-12-29 DIAGNOSIS — Z6829 Body mass index (BMI) 29.0-29.9, adult: Secondary | ICD-10-CM | POA: Diagnosis not present

## 2021-12-29 DIAGNOSIS — R7303 Prediabetes: Secondary | ICD-10-CM | POA: Diagnosis not present

## 2021-12-29 DIAGNOSIS — E785 Hyperlipidemia, unspecified: Secondary | ICD-10-CM | POA: Diagnosis not present

## 2022-03-07 ENCOUNTER — Other Ambulatory Visit: Payer: Self-pay | Admitting: Family Medicine

## 2022-03-07 DIAGNOSIS — Z1231 Encounter for screening mammogram for malignant neoplasm of breast: Secondary | ICD-10-CM

## 2022-03-10 DIAGNOSIS — N1831 Chronic kidney disease, stage 3a: Secondary | ICD-10-CM | POA: Diagnosis not present

## 2022-03-10 DIAGNOSIS — R7303 Prediabetes: Secondary | ICD-10-CM | POA: Diagnosis not present

## 2022-03-10 DIAGNOSIS — E785 Hyperlipidemia, unspecified: Secondary | ICD-10-CM | POA: Diagnosis not present

## 2022-03-10 DIAGNOSIS — U071 COVID-19: Secondary | ICD-10-CM | POA: Diagnosis not present

## 2022-03-31 ENCOUNTER — Ambulatory Visit: Payer: Medicare Other

## 2022-04-25 ENCOUNTER — Ambulatory Visit
Admission: RE | Admit: 2022-04-25 | Discharge: 2022-04-25 | Disposition: A | Discharge status: Home / Self Care | Payer: Medicare Other | Source: Ambulatory Visit | Attending: Family Medicine | Admitting: Family Medicine

## 2022-04-25 DIAGNOSIS — Z1231 Encounter for screening mammogram for malignant neoplasm of breast: Secondary | ICD-10-CM

## 2022-05-26 DIAGNOSIS — H2513 Age-related nuclear cataract, bilateral: Secondary | ICD-10-CM | POA: Diagnosis not present

## 2022-05-26 DIAGNOSIS — E119 Type 2 diabetes mellitus without complications: Secondary | ICD-10-CM | POA: Diagnosis not present

## 2022-05-26 DIAGNOSIS — H52203 Unspecified astigmatism, bilateral: Secondary | ICD-10-CM | POA: Diagnosis not present

## 2022-06-10 IMAGING — MG MM DIGITAL SCREENING BILAT W/ TOMO AND CAD
8 series · 8 of 24 positions shown · non-contrast
Comparison: Previous exam(s).

CLINICAL DATA: Screening.

EXAM:
DIGITAL SCREENING BILATERAL MAMMOGRAM WITH TOMOSYNTHESIS AND CAD
TECHNIQUE: Bilateral screening digital craniocaudal and mediolateral oblique
mammograms were obtained. Bilateral screening digital breast
tomosynthesis was performed. The images were evaluated with
computer-aided detection.

[R CC synth-2D]
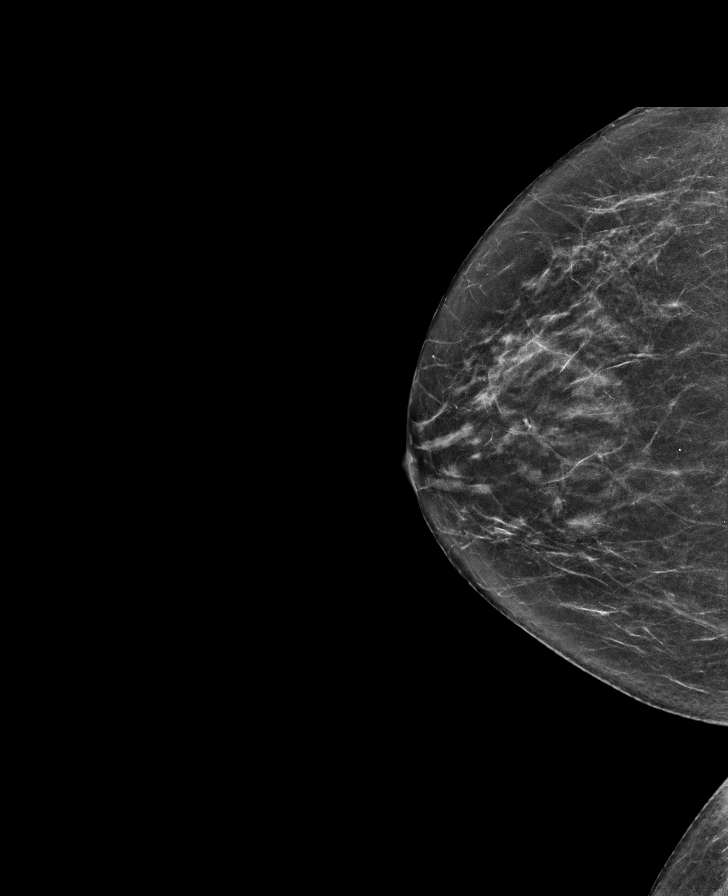

[L MLO synth-2D]
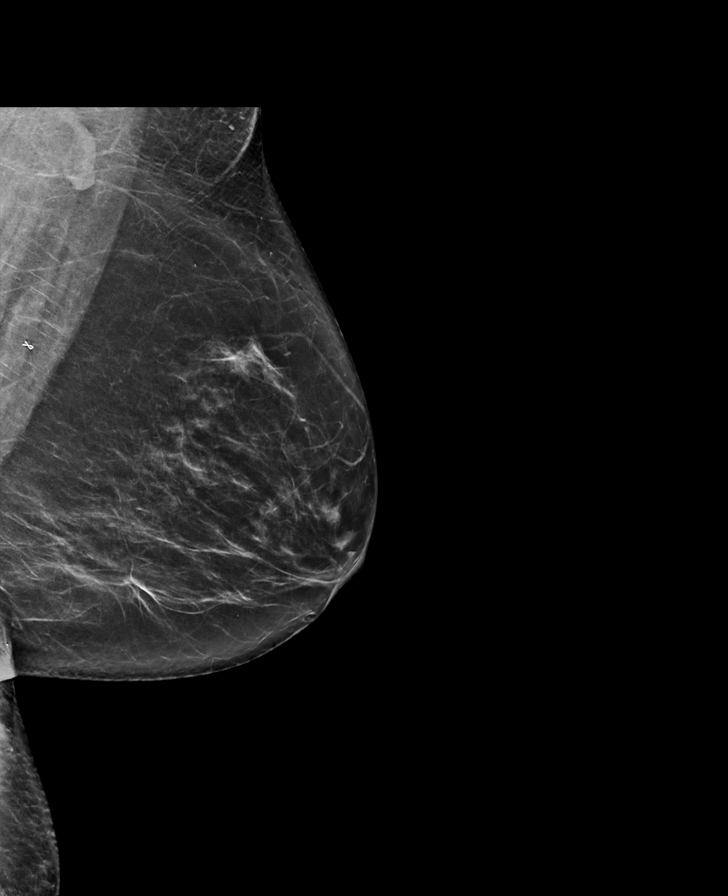

[R MLO synth-2D]
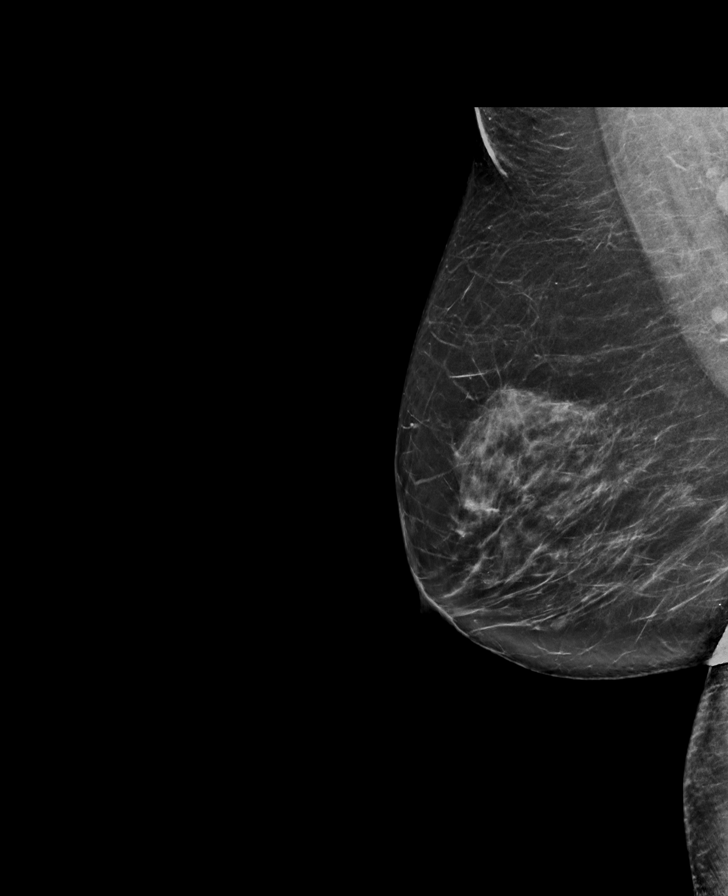

[L CC synth-2D]
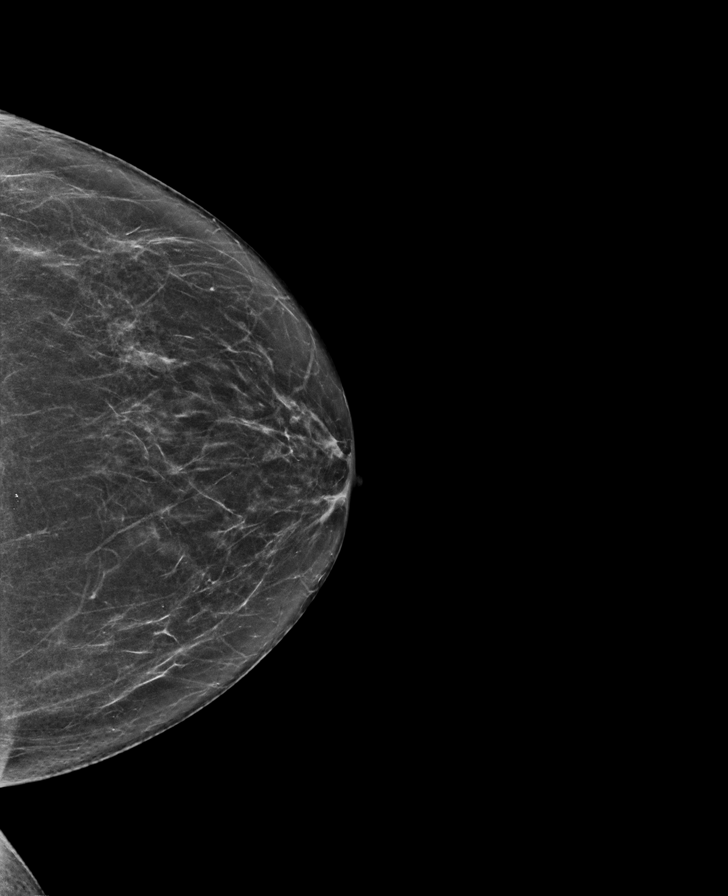

[L CC tomo · tomo slice 31/61.0]
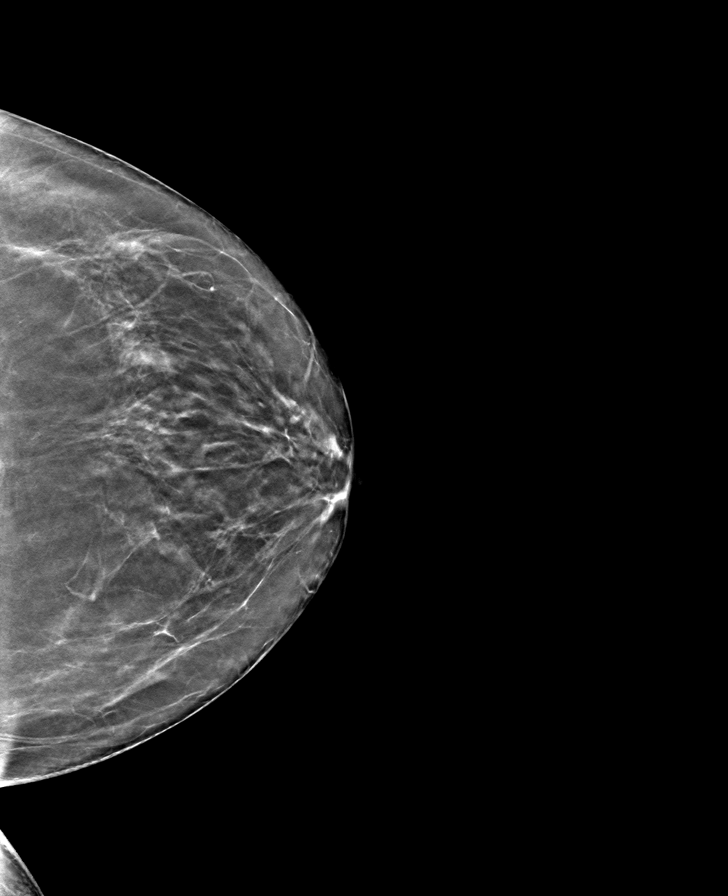

[L MLO tomo · tomo slice 37/74.0]
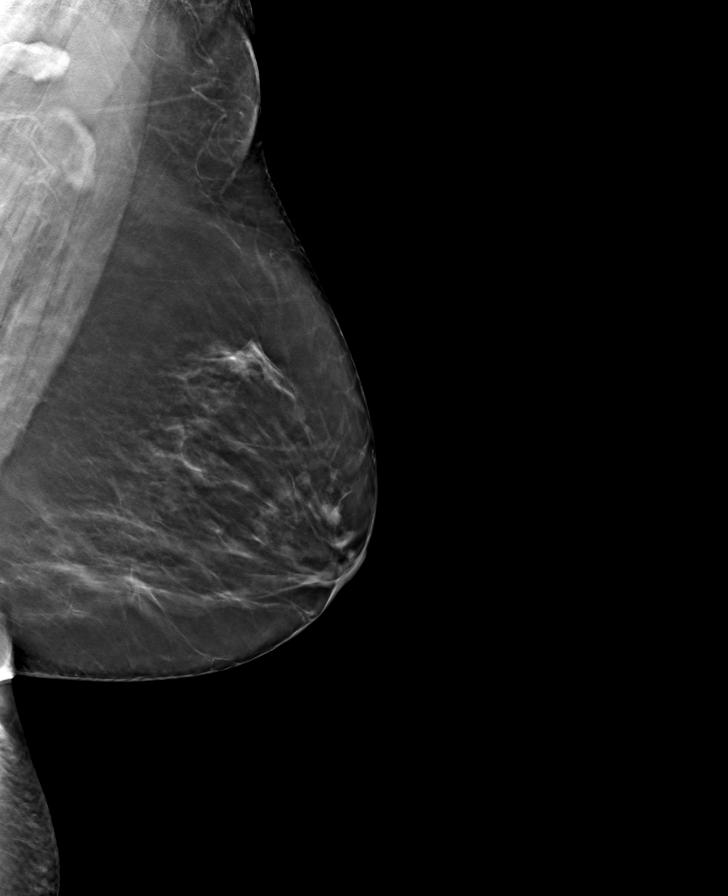

[R CC tomo · tomo slice 31/60.0]
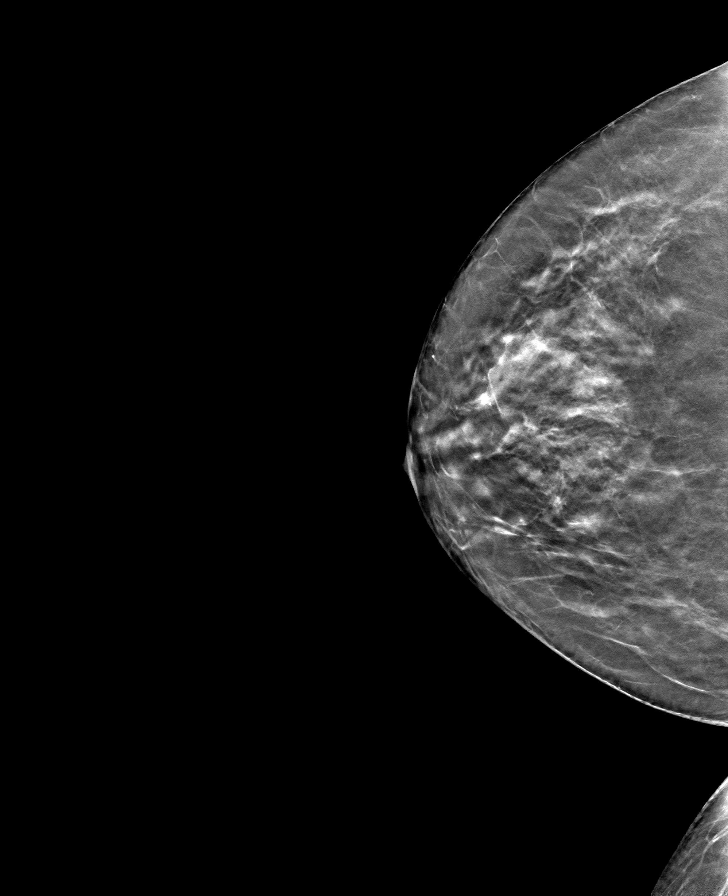

[R MLO tomo · tomo slice 38/75.0]
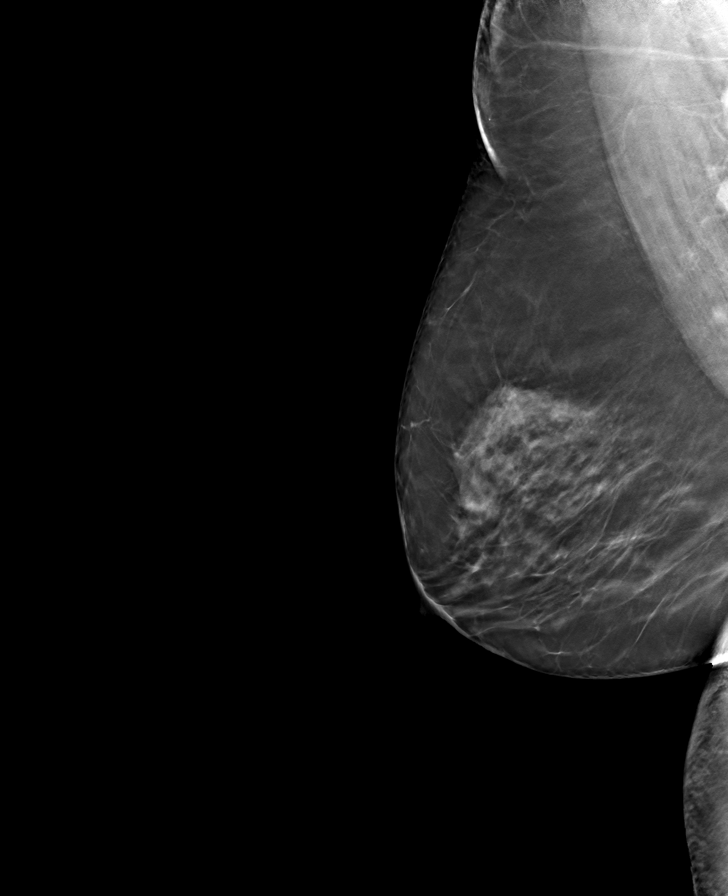

[8 of 24 positions shown; findings below may reference images not displayed]

ACR Breast Density Category b: There are scattered areas of
fibroglandular density.
FINDINGS: There are no findings suspicious for malignancy.
IMPRESSION: No mammographic evidence of malignancy. A result letter of this
screening mammogram will be mailed directly to the patient.

RECOMMENDATION:
Screening mammogram in one year. (Code:51-O-LD2)

BI-RADS CATEGORY  1: Negative.

## 2022-06-15 DIAGNOSIS — Z0000 Encounter for general adult medical examination without abnormal findings: Secondary | ICD-10-CM | POA: Diagnosis not present

## 2022-06-15 DIAGNOSIS — Z6828 Body mass index (BMI) 28.0-28.9, adult: Secondary | ICD-10-CM | POA: Diagnosis not present

## 2022-06-15 DIAGNOSIS — Z1389 Encounter for screening for other disorder: Secondary | ICD-10-CM | POA: Diagnosis not present

## 2022-06-28 ENCOUNTER — Other Ambulatory Visit: Payer: Self-pay | Admitting: Family Medicine

## 2022-06-28 DIAGNOSIS — N644 Mastodynia: Secondary | ICD-10-CM

## 2022-06-28 DIAGNOSIS — M533 Sacrococcygeal disorders, not elsewhere classified: Secondary | ICD-10-CM | POA: Diagnosis not present

## 2022-07-06 ENCOUNTER — Ambulatory Visit
Admission: RE | Admit: 2022-07-06 | Discharge: 2022-07-06 | Disposition: A | Discharge status: Home / Self Care | Payer: Medicare Other | Source: Ambulatory Visit | Attending: Family Medicine | Admitting: Family Medicine

## 2022-07-06 DIAGNOSIS — N644 Mastodynia: Secondary | ICD-10-CM

## 2022-12-13 DIAGNOSIS — E785 Hyperlipidemia, unspecified: Secondary | ICD-10-CM | POA: Diagnosis not present

## 2022-12-13 DIAGNOSIS — L603 Nail dystrophy: Secondary | ICD-10-CM | POA: Diagnosis not present

## 2022-12-13 DIAGNOSIS — Z6829 Body mass index (BMI) 29.0-29.9, adult: Secondary | ICD-10-CM | POA: Diagnosis not present

## 2022-12-13 DIAGNOSIS — Q846 Other congenital malformations of nails: Secondary | ICD-10-CM | POA: Diagnosis not present

## 2022-12-13 DIAGNOSIS — N1831 Chronic kidney disease, stage 3a: Secondary | ICD-10-CM | POA: Diagnosis not present

## 2022-12-13 DIAGNOSIS — R7303 Prediabetes: Secondary | ICD-10-CM | POA: Diagnosis not present

## 2022-12-13 DIAGNOSIS — I129 Hypertensive chronic kidney disease with stage 1 through stage 4 chronic kidney disease, or unspecified chronic kidney disease: Secondary | ICD-10-CM | POA: Diagnosis not present

## 2023-01-09 DIAGNOSIS — D649 Anemia, unspecified: Secondary | ICD-10-CM | POA: Diagnosis not present

## 2023-03-06 DIAGNOSIS — L821 Other seborrheic keratosis: Secondary | ICD-10-CM | POA: Diagnosis not present

## 2023-03-06 DIAGNOSIS — L609 Nail disorder, unspecified: Secondary | ICD-10-CM | POA: Diagnosis not present

## 2023-03-06 DIAGNOSIS — L820 Inflamed seborrheic keratosis: Secondary | ICD-10-CM | POA: Diagnosis not present

## 2023-04-11 ENCOUNTER — Other Ambulatory Visit: Payer: Self-pay | Admitting: Family Medicine

## 2023-04-11 DIAGNOSIS — Z1231 Encounter for screening mammogram for malignant neoplasm of breast: Secondary | ICD-10-CM

## 2023-04-26 DIAGNOSIS — M654 Radial styloid tenosynovitis [de Quervain]: Secondary | ICD-10-CM | POA: Diagnosis not present

## 2023-04-26 DIAGNOSIS — Z6830 Body mass index (BMI) 30.0-30.9, adult: Secondary | ICD-10-CM | POA: Diagnosis not present

## 2023-05-02 ENCOUNTER — Ambulatory Visit: Payer: Medicare Other

## 2023-05-16 ENCOUNTER — Ambulatory Visit
Admission: RE | Admit: 2023-05-16 | Discharge: 2023-05-16 | Disposition: A | Discharge status: Home / Self Care | Payer: Medicare Other | Source: Ambulatory Visit | Attending: Family Medicine | Admitting: Family Medicine

## 2023-05-16 DIAGNOSIS — Z1231 Encounter for screening mammogram for malignant neoplasm of breast: Secondary | ICD-10-CM

## 2023-05-30 DIAGNOSIS — E119 Type 2 diabetes mellitus without complications: Secondary | ICD-10-CM | POA: Diagnosis not present

## 2023-05-30 DIAGNOSIS — H52203 Unspecified astigmatism, bilateral: Secondary | ICD-10-CM | POA: Diagnosis not present

## 2023-05-30 DIAGNOSIS — H2513 Age-related nuclear cataract, bilateral: Secondary | ICD-10-CM | POA: Diagnosis not present

## 2023-05-31 DIAGNOSIS — M654 Radial styloid tenosynovitis [de Quervain]: Secondary | ICD-10-CM | POA: Diagnosis not present

## 2023-06-19 DIAGNOSIS — Z1331 Encounter for screening for depression: Secondary | ICD-10-CM | POA: Diagnosis not present

## 2023-06-19 DIAGNOSIS — Z6829 Body mass index (BMI) 29.0-29.9, adult: Secondary | ICD-10-CM | POA: Diagnosis not present

## 2023-06-19 DIAGNOSIS — Z0000 Encounter for general adult medical examination without abnormal findings: Secondary | ICD-10-CM | POA: Diagnosis not present

## 2023-07-03 DIAGNOSIS — R32 Unspecified urinary incontinence: Secondary | ICD-10-CM | POA: Diagnosis not present

## 2023-07-03 DIAGNOSIS — N1831 Chronic kidney disease, stage 3a: Secondary | ICD-10-CM | POA: Diagnosis not present

## 2023-07-03 DIAGNOSIS — E663 Overweight: Secondary | ICD-10-CM | POA: Diagnosis not present

## 2023-07-03 DIAGNOSIS — L309 Dermatitis, unspecified: Secondary | ICD-10-CM | POA: Diagnosis not present

## 2023-07-03 DIAGNOSIS — R7303 Prediabetes: Secondary | ICD-10-CM | POA: Diagnosis not present

## 2023-07-03 DIAGNOSIS — D649 Anemia, unspecified: Secondary | ICD-10-CM | POA: Diagnosis not present

## 2023-07-03 DIAGNOSIS — E785 Hyperlipidemia, unspecified: Secondary | ICD-10-CM | POA: Diagnosis not present

## 2023-07-03 DIAGNOSIS — Z6828 Body mass index (BMI) 28.0-28.9, adult: Secondary | ICD-10-CM | POA: Diagnosis not present

## 2023-07-03 DIAGNOSIS — R4789 Other speech disturbances: Secondary | ICD-10-CM | POA: Diagnosis not present

## 2023-07-03 DIAGNOSIS — I129 Hypertensive chronic kidney disease with stage 1 through stage 4 chronic kidney disease, or unspecified chronic kidney disease: Secondary | ICD-10-CM | POA: Diagnosis not present

## 2023-07-24 ENCOUNTER — Other Ambulatory Visit: Payer: Self-pay | Admitting: Family Medicine

## 2023-07-24 DIAGNOSIS — R4789 Other speech disturbances: Secondary | ICD-10-CM

## 2023-08-02 DIAGNOSIS — M654 Radial styloid tenosynovitis [de Quervain]: Secondary | ICD-10-CM | POA: Diagnosis not present

## 2023-08-14 DIAGNOSIS — J019 Acute sinusitis, unspecified: Secondary | ICD-10-CM | POA: Diagnosis not present

## 2023-08-25 ENCOUNTER — Other Ambulatory Visit: Payer: Self-pay

## 2023-08-25 ENCOUNTER — Encounter: Payer: Self-pay | Admitting: Physical Therapy

## 2023-08-25 ENCOUNTER — Ambulatory Visit: Payer: Medicare Other | Attending: Family Medicine | Admitting: Physical Therapy

## 2023-08-25 DIAGNOSIS — R293 Abnormal posture: Secondary | ICD-10-CM | POA: Diagnosis not present

## 2023-08-25 DIAGNOSIS — R279 Unspecified lack of coordination: Secondary | ICD-10-CM | POA: Insufficient documentation

## 2023-08-25 DIAGNOSIS — M6281 Muscle weakness (generalized): Secondary | ICD-10-CM | POA: Diagnosis not present

## 2023-08-25 NOTE — Patient Instructions (Signed)
 Urge Incontinence  Ideal urination frequency is every 2-4 wakeful hours, which equates to 5-8 times within a 24-hour period.   Urge incontinence is leakage that occurs when the bladder muscle contracts, creating a sudden need to go before getting to the bathroom.   Going too often when your bladder isn't actually full can disrupt the body's automatic signals to store and hold urine longer, which will increase urgency/frequency.  In this case, the bladder "is running the show" and strategies can be learned to retrain this pattern.   One should be able to control the first urge to urinate, at around .  The bladder can hold up to a "grande latte," or . To help you gain control, practice the Urge Drill below when urgency strikes.  This drill will help retrain your bladder signals and allow you to store and hold urine longer.  The overall goal is to stretch out your time between voids to reach a more manageable voiding schedule.    Practice your "quick flicks" often throughout the day (each waking hour) even when you don't need feel the urge to go.  This will help strengthen your pelvic floor muscles, making them more effective in controlling leakage.  Urge Drill  When you feel an urge to go, follow these steps to regain control: Stop what you are doing and be still Take one deep breath, directing your air into your abdomen Think an affirming thought, such as "I've got this." Do 5 quick flicks of your pelvic floor Walk with control to the bathroom to void, or delay voiding  Voiding Schedule: As soon as you wake up, go to the bathroom and empty your bladder. Then, every 2 hours, set an alarm to go to the bathroom, even if you don't have to. Do this for 3 full days, practicing your urge drill when you need to. After 3 successful days with this routine, increase your voiding interval to 2.5 hours and do this for 3 days. Then 3 hours, etc.

## 2023-08-25 NOTE — Therapy (Signed)
 OUTPATIENT PHYSICAL THERAPY FEMALE PELVIC EVALUATION   Patient Name: Jordan Boyle MRN: [id number] DOB:1951/04/18, 73 y.o., female Today's Date: 08/25/2023  END OF SESSION:  PT End of Session - 08/25/23 1033     Visit Number 1    Number of Visits 8    Date for PT Re-Evaluation 10/20/23    Authorization Type Medicare/BCBS    PT Start Time 0930    PT Stop Time 1015    PT Time Calculation (min) 45 min    Activity Tolerance Patient tolerated treatment well    Behavior During Therapy [facility] for tasks assessed/performed             History reviewed. No pertinent past medical history. Past Surgical History:  Procedure Laterality Date   BREAST BIOPSY Left    There are no active problems to display for this patient.   PCP: Sigmund Hazel, MD   REFERRING PROVIDER: Sigmund Hazel, MD   REFERRING DIAG: R32 (ICD-10-CM) - Urine incontinence  THERAPY DIAG:  Muscle weakness (generalized)  Unspecified lack of coordination  Abnormal posture  Rationale for Evaluation and Treatment: Rehabilitation  ONSET DATE: 1 year ago   SUBJECTIVE:                                                                                                                                                                                           SUBJECTIVE STATEMENT: Within the last year she has starting noticing increased urinary urgency and leakage with increased urgency when she stands up from a sitting position. When she is comfortable in the car and stands up, she will need to go. She will also notice this when pulling in the driveway. She will occasionally have pain in the back side of her left hip.  Fluid intake: drinks 2 16.9 water bottles per day, green tea (2 cups per day), decaf coffee (1x/day), will occasionally drink juice (orange, cran-grape), will occasionally drink a mini coke or ginger ale   PAIN:  Are you having pain? No NPRS scale: 0/10  PRECAUTIONS: None  RED FLAGS: None   WEIGHT  BEARING RESTRICTIONS: No  FALLS:  Has patient fallen in last 6 months? No  OCCUPATION: retired in 2007  ACTIVITY LEVEL : sporadic physical activity, hasn't walked since the summer   PLOF: Independent  PATIENT GOALS: decreased urinary leakage, stop having to do the quick step to the toilet   PERTINENT HISTORY:  Hysterectomy, tubal ligation Sexual abuse: No  BOWEL MOVEMENT: Pain with bowel movement: No Type of bowel movement:Type (Bristol Stool Scale) 4, Frequency 1x/day, Strain depends, and Splinting   Fully empty rectum: No - sometimes  has to go back and have a second bowel movement  Leakage: No Pads: No Fiber supplement/laxative No  URINATION: Pain with urination: No Fully empty bladder: Yes:   Stream: Strong Urgency: Yes  Frequency: she will go even when she has no urgency to ensure she won't leak on the way  Leakage: Urge to void, Walking to the bathroom, and Laughing Pads: Yes: only if she's going to be out for a long time or traveling   INTERCOURSE: yes   Ability to have vaginal penetration Yes  Pain with intercourse: Initial Penetration and After Intercourse DrynessYes  Climax: yes Marinoff Scale: 1/3 Uses lubricant   PREGNANCY: Vaginal deliveries 2 Tearing Yes: tearing but she doesn't remember  C-section deliveries 0  PROLAPSE: None  OBJECTIVE:  Note: Objective measures were completed at Evaluation unless otherwise noted.  PATIENT SURVEYS:  PFIQ-7: 10  COGNITION: Overall cognitive status: Within functional limits for tasks assessed     SENSATION: Light touch: Appears intact  LUMBAR SPECIAL TESTS:  Single leg stance test: Positive  FUNCTIONAL TESTS:  Unable to get up from the floor to standing, able to perform STS with no pain   GAIT: Comments: moderate trendelenburg gait pattern with ambulation   POSTURE: rounded shoulders, forward head, and increased thoracic kyphosis   LUMBARAROM/PROM:  A/PROM A/PROM  eval  Flexion 25 % limited   Extension 50% limited  Right lateral flexion 25% limited  Left lateral flexion 25% limited  Right rotation Within normal limits   Left rotation Within normal limits    (Blank rows = not tested)  LOWER EXTREMITY ROM: within functional limits   Active ROM Right eval Left eval  Hip flexion    Hip extension    Hip abduction    Hip adduction    Hip internal rotation    Hip external rotation    Knee flexion    Knee extension    Ankle dorsiflexion    Ankle plantarflexion    Ankle inversion    Ankle eversion     (Blank rows = not tested)  LOWER EXTREMITY MMT: 4-/5 bilateral knees and hips grossly  MMT Right eval Left eval  Hip flexion    Hip extension    Hip abduction    Hip adduction    Hip internal rotation    Hip external rotation    Knee flexion    Knee extension    Ankle dorsiflexion    Ankle plantarflexion    Ankle inversion    Ankle eversion     (Blank rows = not tested) PALPATION:   General: lumbopelvic stiffness present, primarily left hip posteriorly, above iliac crest  Pelvic Alignment: within normal limits   Abdominal: upper chest breathing, abdominal bracing at rest                External Perineal Exam: deferred due to time, assess at next visit                             Internal Pelvic Floor:  deferred due to time, assess at next visit  Patient confirms identification and approves PT to assess internal pelvic floor and treatment Yes No emotional/communication barriers or cognitive limitation. Patient is motivated to learn. Patient understands and agrees with treatment goals and plan. PT explains patient will be examined in standing, sitting, and lying down to see how their muscles and joints work. When they are ready, they will be asked to remove  their underwear so PT can examine their perineum. The patient is also given the option of providing their own chaperone as one is not provided in our facility. The patient also has the right and is explained  the right to defer or refuse any part of the evaluation or treatment including the internal exam. With the patient's consent, PT will use one gloved finger to gently assess the muscles of the pelvic floor, seeing how well it contracts and relaxes and if there is muscle symmetry. After, the patient will get dressed and PT and patient will discuss exam findings and plan of care. PT and patient discuss plan of care, schedule, attendance policy and HEP activities.  PELVIC MMT: deferred due to time, assess at next visit   MMT eval  Vaginal  deferred due to time, assess at next visit  Internal Anal Sphincter   External Anal Sphincter   Puborectalis   Diastasis Recti   (Blank rows = not tested)        TONE:  deferred due to time, assess at next visit  PROLAPSE:  deferred due to time, assess at next visit  TODAY'S TREATMENT:                                                                                                                              DATE:   EVAL 08/25/23: Examination completed, findings reviewed, pt educated on POC, HEP, and self care. Pt motivated to participate in PT and agreeable to attempt recommendations.  Neuro re-ed:  Urge drill: diaphragmatic breathing x3, pelvic floor quick flick contractions (glute squeeze present as well) to control urinary urgency when getting out of car, walking into house, and stepping into bathroom Diaphragmatic breathing education + connection to pelvic floor  Self care: Relative pelvic anatomy and pelvic floor muscle education, connection between diaphragm and pelvic floor, intraabdominal pressure management and the pelvic floor, how the pelvic floor controls urinary output and control  PATIENT EDUCATION:  Education details: Relative pelvic anatomy and pelvic floor muscle education, connection between diaphragm and pelvic floor, intraabdominal pressure management and the pelvic floor, how the pelvic floor controls urinary output and control Person  educated: Patient Education method: Explanation, Demonstration, Tactile cues, Verbal cues, and Handouts Education comprehension: verbalized understanding, returned demonstration, verbal cues required, tactile cues required, and needs further education  HOME EXERCISE PROGRAM: Create at follow up with introductory pelvic floor training exercises based on examination findings.   ASSESSMENT:  CLINICAL IMPRESSION: Patient is a 73 y.o. female  who was seen today for physical therapy evaluation and treatment for urinary incontinence and urgency. Since last spring, patient has noticed decreased urinary control with urgency when she pulls into her driveway, when standing up from a seated position, and with sudden temperature changes. Her leakage amount can be small or large depending on her sense of urgency. She will also experience urinary leakage with a very hard laugh. Patient educated on relative pelvic floor anatomy  and the connection between the diaphragm and pelvic floor. Patient demonstrates understanding of urinary urgency drill for controlling leakage. Internal examination deferred today due to time limitations, but will be performed at next visit per patient consent. Patient demonstrates lumbopelvic weakness with transfers and strength testing. Pt would benefit from additional PT to further address urinary urgency/leakage, pelvic floor control and coordination, and generalized lumbopelvic weakness.    OBJECTIVE IMPAIRMENTS: decreased coordination, decreased endurance, decreased ROM, and decreased strength.   ACTIVITY LIMITATIONS: continence  PARTICIPATION LIMITATIONS:  none  PERSONAL FACTORS: Age, Past/current experiences, and Time since onset of injury/illness/exacerbation are also affecting patient's functional outcome.   REHAB POTENTIAL: Good  CLINICAL DECISION MAKING: Stable/uncomplicated  EVALUATION COMPLEXITY: Low   GOALS: Goals reviewed with patient? Yes  SHORT TERM GOALS:  Target date: 09/22/2023  Pt will be independent with HEP.  Baseline: Goal status: INITIAL  2.  Pt will be independent with the knack, urge suppression technique, and double voiding in order to improve bladder habits and decrease urinary incontinence.   Baseline:  Goal status: INITIAL  3.  Pt will have 50% less urgency due to bladder retraining and strengthening to improve quality of life and leakage secondary to urgency. Baseline:  Goal status: INITIAL  LONG TERM GOALS: Target date:   Pt will be independent with advanced HEP.  Baseline:  Goal status: INITIAL  2.  Pt will report UIQ-7 score of < or = to 5 points to suggest decreased functional limitations secondary to urinary urgency and leakage to improve quality of life.   Baseline: 10 points Goal status: INITIAL  3.  Pt to demonstrate at least 4+/5 bil hip strength for improved pelvic stability and functional squats without leakage.  Baseline:  Goal status: INITIAL  4.  Pt will be able to lift at least 10 lb correctly for 10 reps without pain or leakage for functional activity continence and to improve quality of life.  Baseline:  Goal status: INITIAL  PLAN:  PT FREQUENCY: 1x/week  PT DURATION: 8 weeks  PLANNED INTERVENTIONS: 97110-Therapeutic exercises, 97530- Therapeutic activity, 97112- Neuromuscular re-education, 97535- Self Care, [id number]- Manual therapy, Taping, Dry Needling, Joint mobilization, Spinal mobilization, Scar mobilization, Cryotherapy, and Moist heat  PLAN FOR NEXT SESSION: internal pelvic floor assessment; introduce pelvic floor AROM with breathing, review urge drill   Omar Person, PT 08/25/2023, 10:34 AM

## 2023-08-26 ENCOUNTER — Ambulatory Visit
Admission: RE | Admit: 2023-08-26 | Discharge: 2023-08-26 | Disposition: A | Discharge status: Home / Self Care | Payer: Medicare Other | Source: Ambulatory Visit | Attending: Family Medicine | Admitting: Family Medicine

## 2023-08-26 DIAGNOSIS — R4789 Other speech disturbances: Secondary | ICD-10-CM

## 2023-08-26 DIAGNOSIS — R9082 White matter disease, unspecified: Secondary | ICD-10-CM | POA: Diagnosis not present

## 2023-08-26 DIAGNOSIS — G319 Degenerative disease of nervous system, unspecified: Secondary | ICD-10-CM | POA: Diagnosis not present

## 2023-08-26 DIAGNOSIS — R410 Disorientation, unspecified: Secondary | ICD-10-CM | POA: Diagnosis not present

## 2023-08-26 MED ORDER — GADOPICLENOL 0.5 MMOL/ML IV SOLN
7.0000 mL | Freq: Once | INTRAVENOUS | Status: AC | PRN
  Administered 2023-08-26: 7 mL via INTRAVENOUS

## 2023-09-26 ENCOUNTER — Ambulatory Visit: Attending: Family Medicine | Admitting: Physical Therapy

## 2023-09-26 DIAGNOSIS — R293 Abnormal posture: Secondary | ICD-10-CM | POA: Insufficient documentation

## 2023-09-26 DIAGNOSIS — M6281 Muscle weakness (generalized): Secondary | ICD-10-CM | POA: Diagnosis not present

## 2023-09-26 DIAGNOSIS — R279 Unspecified lack of coordination: Secondary | ICD-10-CM | POA: Insufficient documentation

## 2023-09-26 NOTE — Therapy (Signed)
 OUTPATIENT PHYSICAL THERAPY FEMALE PELVIC TREATMENT   Patient Name: Jordan Boyle MRN: [id number] DOB:03/26/1951, 73 y.o., female Today's Date: 09/26/2023  END OF SESSION:  PT End of Session - 09/26/23 0917     Visit Number 2    Number of Visits 8    Date for PT Re-Evaluation 10/20/23    Authorization Type Medicare/BCBS    PT Start Time 0845    PT Stop Time 0930    PT Time Calculation (min) 45 min    Activity Tolerance Patient tolerated treatment well    Behavior During Therapy [facility] for tasks assessed/performed              No past medical history on file. Past Surgical History:  Procedure Laterality Date   BREAST BIOPSY Left    There are no active problems to display for this patient.   PCP: Sigmund Hazel, MD   REFERRING PROVIDER: Sigmund Hazel, MD   REFERRING DIAG: R32 (ICD-10-CM) - Urine incontinence  THERAPY DIAG:  Muscle weakness (generalized)  Unspecified lack of coordination  Abnormal posture  Rationale for Evaluation and Treatment: Rehabilitation  ONSET DATE: 1 year ago   SUBJECTIVE:                                                                                                                                                                                           SUBJECTIVE STATEMENT: Urge drill has been helping with leakage, but she has been inconsistent with HEP. She has been taxes and sitting a lot so her left hip has been hurting. She has been using Voltaren gel to help with the pain. There has been only 1 episode of incontinence since evaluation.   From eval: Within the last year she has starting noticing increased urinary urgency and leakage with increased urgency when she stands up from a sitting position. When she is comfortable in the car and stands up, she will need to go. She will also notice this when pulling in the driveway. She will occasionally have pain in the back side of her left hip.  Fluid intake: drinks 2 16.9 water bottles  per day, green tea (2 cups per day), decaf coffee (1x/day), will occasionally drink juice (orange, cran-grape), will occasionally drink a mini coke or ginger ale   PAIN:  Are you having pain? No NPRS scale: 0/10  PRECAUTIONS: None  RED FLAGS: None   WEIGHT BEARING RESTRICTIONS: No  FALLS:  Has patient fallen in last 6 months? No  OCCUPATION: retired in 2007  ACTIVITY LEVEL : sporadic physical activity, hasn't walked since the summer   PLOF: Independent  PATIENT GOALS:  decreased urinary leakage, stop having to do the quick step to the toilet   PERTINENT HISTORY:  Hysterectomy, tubal ligation Sexual abuse: No  BOWEL MOVEMENT: Pain with bowel movement: No Type of bowel movement:Type (Bristol Stool Scale) 4, Frequency 1x/day, Strain depends, and Splinting   Fully empty rectum: No - sometimes has to go back and have a second bowel movement  Leakage: No Pads: No Fiber supplement/laxative No  URINATION: Pain with urination: No Fully empty bladder: Yes:   Stream: Strong Urgency: Yes  Frequency: she will go even when she has no urgency to ensure she won't leak on the way  Leakage: Urge to void, Walking to the bathroom, and Laughing Pads: Yes: only if she's going to be out for a long time or traveling   INTERCOURSE: yes   Ability to have vaginal penetration Yes  Pain with intercourse: Initial Penetration and After Intercourse DrynessYes  Climax: yes Marinoff Scale: 1/3 Uses lubricant   PREGNANCY: Vaginal deliveries 2 Tearing Yes: tearing but she doesn't remember  C-section deliveries 0  PROLAPSE: None  OBJECTIVE:  Note: Objective measures were completed at Evaluation unless otherwise noted.  PATIENT SURVEYS:  PFIQ-7: 10  COGNITION: Overall cognitive status: Within functional limits for tasks assessed     SENSATION: Light touch: Appears intact  LUMBAR SPECIAL TESTS:  Single leg stance test: Positive  FUNCTIONAL TESTS:  Unable to get up from the floor  to standing, able to perform STS with no pain   GAIT: Comments: moderate trendelenburg gait pattern with ambulation   POSTURE: rounded shoulders, forward head, and increased thoracic kyphosis   LUMBARAROM/PROM:  A/PROM A/PROM  eval  Flexion 25 % limited  Extension 50% limited  Right lateral flexion 25% limited  Left lateral flexion 25% limited  Right rotation Within normal limits   Left rotation Within normal limits    (Blank rows = not tested)  LOWER EXTREMITY ROM: within functional limits   Active ROM Right eval Left eval  Hip flexion    Hip extension    Hip abduction    Hip adduction    Hip internal rotation    Hip external rotation    Knee flexion    Knee extension    Ankle dorsiflexion    Ankle plantarflexion    Ankle inversion    Ankle eversion     (Blank rows = not tested)  LOWER EXTREMITY MMT: 4-/5 bilateral knees and hips grossly  MMT Right eval Left eval  Hip flexion    Hip extension    Hip abduction    Hip adduction    Hip internal rotation    Hip external rotation    Knee flexion    Knee extension    Ankle dorsiflexion    Ankle plantarflexion    Ankle inversion    Ankle eversion     (Blank rows = not tested) PALPATION:   General: lumbopelvic stiffness present, primarily left hip posteriorly, above iliac crest  Pelvic Alignment: within normal limits   Abdominal: upper chest breathing, abdominal bracing at rest                External Perineal Exam: deferred due to time, assess at next visit                             Internal Pelvic Floor:  deferred due to time, assess at next visit  Patient confirms identification and approves PT to assess internal pelvic  floor and treatment Yes No emotional/communication barriers or cognitive limitation. Patient is motivated to learn. Patient understands and agrees with treatment goals and plan. PT explains patient will be examined in standing, sitting, and lying down to see how their muscles and  joints work. When they are ready, they will be asked to remove their underwear so PT can examine their perineum. The patient is also given the option of providing their own chaperone as one is not provided in our facility. The patient also has the right and is explained the right to defer or refuse any part of the evaluation or treatment including the internal exam. With the patient's consent, PT will use one gloved finger to gently assess the muscles of the pelvic floor, seeing how well it contracts and relaxes and if there is muscle symmetry. After, the patient will get dressed and PT and patient will discuss exam findings and plan of care. PT and patient discuss plan of care, schedule, attendance policy and HEP activities.  PELVIC MMT: deferred due to time, assess at next visit   MMT eval  Vaginal 3/5 manual muscle test, 5 quick flicks, 3 second hold   Internal Anal Sphincter   External Anal Sphincter   Puborectalis   Diastasis Recti   (Blank rows = not tested)        TONE: Within normal limits   PROLAPSE: N/A   TODAY'S TREATMENT:                                                                                                                              DATE:   EVAL 08/25/23: Examination completed, findings reviewed, pt educated on POC, HEP, and self care. Pt motivated to participate in PT and agreeable to attempt recommendations.  Neuro re-ed:  Urge drill: diaphragmatic breathing x3, pelvic floor quick flick contractions (glute squeeze present as well) to control urinary urgency when getting out of car, walking into house, and stepping into bathroom Diaphragmatic breathing education + connection to pelvic floor  Self care: Relative pelvic anatomy and pelvic floor muscle education, connection between diaphragm and pelvic floor, intraabdominal pressure management and the pelvic floor, how the pelvic floor controls urinary output and control  09/26/23: Neuro re-ed:  Urge drill:  diaphragmatic breathing x3, pelvic floor quick flick contractions (glute squeeze present as well) to control urinary urgency when getting out of car, walking into house, and stepping into bathroom Diaphragmatic breathing + pelvic floor lengthening/shortening 2x10 Hooklying pelvic floor quick flicks + diaphragmatic breathing 2x10  Self care: Relative pelvic anatomy and pelvic floor muscle education, connection between diaphragm and pelvic floor, intraabdominal pressure management and the pelvic floor, how the pelvic floor controls urinary output and control  PATIENT EDUCATION:  Education details: Relative pelvic anatomy and pelvic floor muscle education, connection between diaphragm and pelvic floor, intraabdominal pressure management and the pelvic floor, how the pelvic floor controls urinary output and control Person educated: Patient Education method: Explanation, Demonstration,  Tactile cues, Verbal cues, and Handouts Education comprehension: verbalized understanding, returned demonstration, verbal cues required, tactile cues required, and needs further education  HOME EXERCISE PROGRAM: Access Code: [email or web address] URL: [email or web address] Date: 09/26/2023 Prepared by: Earna Coder  Exercises - Supine Pelvic Floor Contraction  - 1 x daily - 7 x weekly - 2 sets - 10 reps - Quick Flick Pelvic Floor Contractions in Hooklying  - 1 x daily - 7 x weekly - 2 sets - 10 reps  ASSESSMENT:  CLINICAL IMPRESSION: Patient is a 73 y.o. female  who was seen today for physical therapy treatment for urinary incontinence and urgency. Internal vaginal examination performed today to assess pelvic floor coordination and strength. Patient demonstrates mild weakness with a lack of coordination present in the musculature when breathing. Patient has a hard time coordinating pelvic floor contractions with exhalation, but with internal cueing, she is able to do this more effectively. No increase in discomfort  following today's session. Pt would benefit from additional PT to further address urinary urgency/leakage, pelvic floor control and coordination, and generalized lumbopelvic weakness.    OBJECTIVE IMPAIRMENTS: decreased coordination, decreased endurance, decreased ROM, and decreased strength.   ACTIVITY LIMITATIONS: continence  PARTICIPATION LIMITATIONS:  none  PERSONAL FACTORS: Age, Past/current experiences, and Time since onset of injury/illness/exacerbation are also affecting patient's functional outcome.   REHAB POTENTIAL: Good  CLINICAL DECISION MAKING: Stable/uncomplicated  EVALUATION COMPLEXITY: Low   GOALS: Goals reviewed with patient? Yes  SHORT TERM GOALS: Target date: 09/22/2023  Pt will be independent with HEP.  Baseline: Goal status: INITIAL  2.  Pt will be independent with the knack, urge suppression technique, and double voiding in order to improve bladder habits and decrease urinary incontinence.   Baseline:  Goal status: INITIAL  3.  Pt will have 50% less urgency due to bladder retraining and strengthening to improve quality of life and leakage secondary to urgency. Baseline:  Goal status: INITIAL  LONG TERM GOALS: Target date:   Pt will be independent with advanced HEP.  Baseline:  Goal status: INITIAL  2.  Pt will report UIQ-7 score of < or = to 5 points to suggest decreased functional limitations secondary to urinary urgency and leakage to improve quality of life.   Baseline: 10 points Goal status: INITIAL  3.  Pt to demonstrate at least 4+/5 bil hip strength for improved pelvic stability and functional squats without leakage.  Baseline:  Goal status: INITIAL  4.  Pt will be able to lift at least 10 lb correctly for 10 reps without pain or leakage for functional activity continence and to improve quality of life.  Baseline:  Goal status: INITIAL  PLAN:  PT FREQUENCY: 1x/week  PT DURATION: 8 weeks  PLANNED INTERVENTIONS: 97110-Therapeutic  exercises, 97530- Therapeutic activity, 97112- Neuromuscular re-education, 97535- Self Care, [id number]- Manual therapy, Taping, Dry Needling, Joint mobilization, Spinal mobilization, Scar mobilization, Cryotherapy, and Moist heat  PLAN FOR NEXT SESSION: internal pelvic floor assessment; introduce pelvic floor AROM with breathing, review urge drill   Omar Person, PT 09/26/2023, 9:17 AM

## 2023-10-04 ENCOUNTER — Ambulatory Visit: Admitting: Physical Therapy

## 2023-10-04 DIAGNOSIS — R293 Abnormal posture: Secondary | ICD-10-CM | POA: Diagnosis not present

## 2023-10-04 DIAGNOSIS — M6281 Muscle weakness (generalized): Secondary | ICD-10-CM | POA: Diagnosis not present

## 2023-10-04 DIAGNOSIS — R279 Unspecified lack of coordination: Secondary | ICD-10-CM | POA: Diagnosis not present

## 2023-10-04 NOTE — Therapy (Signed)
 OUTPATIENT PHYSICAL THERAPY FEMALE PELVIC TREATMENT   Patient Name: Jordan Boyle MRN: [id number] DOB:1951-01-16, 73 y.o., female Today's Date: 10/04/2023  END OF SESSION:  PT End of Session - 10/04/23 1649     Visit Number 3    Number of Visits 8    Date for PT Re-Evaluation 10/20/23    Authorization Type Medicare/BCBS    PT Start Time 0417    PT Stop Time 0453    PT Time Calculation (min) 36 min    Activity Tolerance Patient tolerated treatment well    Behavior During Therapy [facility] for tasks assessed/performed               No past medical history on file. Past Surgical History:  Procedure Laterality Date   BREAST BIOPSY Left    There are no active problems to display for this patient.   PCP: Perley Bradley, MD   REFERRING PROVIDER: Perley Bradley, MD   REFERRING DIAG: R32 (ICD-10-CM) - Urine incontinence  THERAPY DIAG:  Muscle weakness (generalized)  Unspecified lack of coordination  Abnormal posture  Rationale for Evaluation and Treatment: Rehabilitation  ONSET DATE: 1 year ago   SUBJECTIVE:                                                                                                                                                                                           SUBJECTIVE STATEMENT: Patient had a little leakage this afternoon when she was changing clothes - just a few drops.   From eval: Within the last year she has starting noticing increased urinary urgency and leakage with increased urgency when she stands up from a sitting position. When she is comfortable in the car and stands up, she will need to go. She will also notice this when pulling in the driveway. She will occasionally have pain in the back side of her left hip.  Fluid intake: drinks 2 16.9 water bottles per day, green tea (2 cups per day), decaf coffee (1x/day), will occasionally drink juice (orange, cran-grape), will occasionally drink a mini coke or ginger ale   PAIN:   Are you having pain? No NPRS scale: 0/10  PRECAUTIONS: None  RED FLAGS: None   WEIGHT BEARING RESTRICTIONS: No  FALLS:  Has patient fallen in last 6 months? No  OCCUPATION: retired in 2007  ACTIVITY LEVEL : sporadic physical activity, hasn't walked since the summer   PLOF: Independent  PATIENT GOALS: decreased urinary leakage, stop having to do the quick step to the toilet   PERTINENT HISTORY:  Hysterectomy, tubal ligation Sexual abuse: No  BOWEL MOVEMENT: Pain with bowel movement: No  Type of bowel movement:Type (Bristol Stool Scale) 4, Frequency 1x/day, Strain depends, and Splinting   Fully empty rectum: No - sometimes has to go back and have a second bowel movement  Leakage: No Pads: No Fiber supplement/laxative No  URINATION: Pain with urination: No Fully empty bladder: Yes:   Stream: Strong Urgency: Yes  Frequency: she will go even when she has no urgency to ensure she won't leak on the way  Leakage: Urge to void, Walking to the bathroom, and Laughing Pads: Yes: only if she's going to be out for a long time or traveling   INTERCOURSE: yes   Ability to have vaginal penetration Yes  Pain with intercourse: Initial Penetration and After Intercourse DrynessYes  Climax: yes Marinoff Scale: 1/3 Uses lubricant   PREGNANCY: Vaginal deliveries 2 Tearing Yes: tearing but she doesn't remember  C-section deliveries 0  PROLAPSE: None  OBJECTIVE:  Note: Objective measures were completed at Evaluation unless otherwise noted.  PATIENT SURVEYS:  PFIQ-7: 10  COGNITION: Overall cognitive status: Within functional limits for tasks assessed     SENSATION: Light touch: Appears intact  LUMBAR SPECIAL TESTS:  Single leg stance test: Positive  FUNCTIONAL TESTS:  Unable to get up from the floor to standing, able to perform STS with no pain   GAIT: Comments: moderate trendelenburg gait pattern with ambulation   POSTURE: rounded shoulders, forward head, and  increased thoracic kyphosis   LUMBARAROM/PROM:  A/PROM A/PROM  eval  Flexion 25 % limited  Extension 50% limited  Right lateral flexion 25% limited  Left lateral flexion 25% limited  Right rotation Within normal limits   Left rotation Within normal limits    (Blank rows = not tested)  LOWER EXTREMITY ROM: within functional limits   Active ROM Right eval Left eval  Hip flexion    Hip extension    Hip abduction    Hip adduction    Hip internal rotation    Hip external rotation    Knee flexion    Knee extension    Ankle dorsiflexion    Ankle plantarflexion    Ankle inversion    Ankle eversion     (Blank rows = not tested)  LOWER EXTREMITY MMT: 4-/5 bilateral knees and hips grossly  MMT Right eval Left eval  Hip flexion    Hip extension    Hip abduction    Hip adduction    Hip internal rotation    Hip external rotation    Knee flexion    Knee extension    Ankle dorsiflexion    Ankle plantarflexion    Ankle inversion    Ankle eversion     (Blank rows = not tested) PALPATION:   General: lumbopelvic stiffness present, primarily left hip posteriorly, above iliac crest  Pelvic Alignment: within normal limits   Abdominal: upper chest breathing, abdominal bracing at rest                External Perineal Exam: deferred due to time, assess at next visit                             Internal Pelvic Floor:  deferred due to time, assess at next visit  Patient confirms identification and approves PT to assess internal pelvic floor and treatment Yes No emotional/communication barriers or cognitive limitation. Patient is motivated to learn. Patient understands and agrees with treatment goals and plan. PT explains patient will be examined in standing,  sitting, and lying down to see how their muscles and joints work. When they are ready, they will be asked to remove their underwear so PT can examine their perineum. The patient is also given the option of providing their own  chaperone as one is not provided in our facility. The patient also has the right and is explained the right to defer or refuse any part of the evaluation or treatment including the internal exam. With the patient's consent, PT will use one gloved finger to gently assess the muscles of the pelvic floor, seeing how well it contracts and relaxes and if there is muscle symmetry. After, the patient will get dressed and PT and patient will discuss exam findings and plan of care. PT and patient discuss plan of care, schedule, attendance policy and HEP activities.  PELVIC MMT: deferred due to time, assess at next visit   MMT eval  Vaginal 3/5 manual muscle test, 5 quick flicks, 3 second hold   Internal Anal Sphincter   External Anal Sphincter   Puborectalis   Diastasis Recti   (Blank rows = not tested)        TONE: Within normal limits   PROLAPSE: N/A   TODAY'S TREATMENT:                                                                                                                              DATE:   EVAL 08/25/23: Examination completed, findings reviewed, pt educated on POC, HEP, and self care. Pt motivated to participate in PT and agreeable to attempt recommendations.  Neuro re-ed:  Urge drill: diaphragmatic breathing x3, pelvic floor quick flick contractions (glute squeeze present as well) to control urinary urgency when getting out of car, walking into house, and stepping into bathroom Diaphragmatic breathing education + connection to pelvic floor  Self care: Relative pelvic anatomy and pelvic floor muscle education, connection between diaphragm and pelvic floor, intraabdominal pressure management and the pelvic floor, how the pelvic floor controls urinary output and control  09/26/23: Neuro re-ed:  Urge drill: diaphragmatic breathing x3, pelvic floor quick flick contractions (glute squeeze present as well) to control urinary urgency when getting out of car, walking into house, and stepping  into bathroom Diaphragmatic breathing + pelvic floor lengthening/shortening 2x10 Hooklying pelvic floor quick flicks + diaphragmatic breathing 2x10  Self care: Relative pelvic anatomy and pelvic floor muscle education, connection between diaphragm and pelvic floor, intraabdominal pressure management and the pelvic floor, how the pelvic floor controls urinary output and control  10/04/23: Neuro re-ed:  Hooklying pelvic floor quick flicks + diaphragmatic breathing 2x10  Sidelying clamshell + diaphragmatic breathing 2x10  Bridge + adductor ball squeeze + diaphragmatic breathing 2x10  Sit to stand + diaphragmatic breathing 2x10  Hooklying diaphragmatic breathing + adductor ball squeeze 2x10  Self care: Relative pelvic anatomy and pelvic floor muscle education, connection between diaphragm and pelvic floor, intraabdominal pressure management and the pelvic floor, how the pelvic  floor controls urinary output and control  PATIENT EDUCATION:  Education details: Relative pelvic anatomy and pelvic floor muscle education, connection between diaphragm and pelvic floor, intraabdominal pressure management and the pelvic floor, how the pelvic floor controls urinary output and control Person educated: Patient Education method: Explanation, Demonstration, Tactile cues, Verbal cues, and Handouts Education comprehension: verbalized understanding, returned demonstration, verbal cues required, tactile cues required, and needs further education  HOME EXERCISE PROGRAM: Access Code: [email or web address] URL: [email or web address] Date: 10/04/2023 Prepared by: Robbin Chill  Exercises - Supine Pelvic Floor Contraction  - 1 x daily - 7 x weekly - 2 sets - 10 reps - Quick Flick Pelvic Floor Contractions in Hooklying  - 1 x daily - 7 x weekly - 2 sets - 10 reps - Sit to Stand Without Arm Support  - 1 x daily - 7 x weekly - 2 sets - 10 reps - Clamshell  - 1 x daily - 7 x weekly - 2 sets - 10 reps - Supine Bridge  with Mini Swiss Ball Between Knees  - 1 x daily - 7 x weekly - 2 sets - 10 reps - Supine Hip Adduction Isometric with Ball  - 1 x daily - 7 x weekly - 2 sets - 10 reps  ASSESSMENT:  CLINICAL IMPRESSION: Patient is a 73 y.o. female  who was seen today for physical therapy treatment for urinary incontinence and urgency. Pelvic floor exercises were reviewed as patient has not had much compliance with HEP due to caregiving for her sister. Hip and core strengthening exercises introduced today with max cueing from PT for form and breathing cues. Patient has a hard time coordinating pelvic floor contractions with exhalation, but she plans to practice this more. No increase in discomfort following today's session. Pt would benefit from additional PT to further address urinary urgency/leakage, pelvic floor control and coordination, and generalized lumbopelvic weakness.    OBJECTIVE IMPAIRMENTS: decreased coordination, decreased endurance, decreased ROM, and decreased strength.   ACTIVITY LIMITATIONS: continence  PARTICIPATION LIMITATIONS:  none  PERSONAL FACTORS: Age, Past/current experiences, and Time since onset of injury/illness/exacerbation are also affecting patient's functional outcome.   REHAB POTENTIAL: Good  CLINICAL DECISION MAKING: Stable/uncomplicated  EVALUATION COMPLEXITY: Low   GOALS: Goals reviewed with patient? Yes  SHORT TERM GOALS: Target date: 09/22/2023  Pt will be independent with HEP.  Baseline: Goal status: INITIAL  2.  Pt will be independent with the knack, urge suppression technique, and double voiding in order to improve bladder habits and decrease urinary incontinence.   Baseline:  Goal status: INITIAL  3.  Pt will have 50% less urgency due to bladder retraining and strengthening to improve quality of life and leakage secondary to urgency. Baseline:  Goal status: INITIAL  LONG TERM GOALS: Target date:   Pt will be independent with advanced HEP.  Baseline:   Goal status: INITIAL  2.  Pt will report UIQ-7 score of < or = to 5 points to suggest decreased functional limitations secondary to urinary urgency and leakage to improve quality of life.   Baseline: 10 points Goal status: INITIAL  3.  Pt to demonstrate at least 4+/5 bil hip strength for improved pelvic stability and functional squats without leakage.  Baseline:  Goal status: INITIAL  4.  Pt will be able to lift at least 10 lb correctly for 10 reps without pain or leakage for functional activity continence and to improve quality of life.  Baseline:  Goal status: INITIAL  PLAN:  PT FREQUENCY:  1x/week  PT DURATION: 8 weeks  PLANNED INTERVENTIONS: 97110-Therapeutic exercises, 97530- Therapeutic activity, 97112- Neuromuscular re-education, 97535- Self Care, 16109- Manual therapy, Taping, Dry Needling, Joint mobilization, Spinal mobilization, Scar mobilization, Cryotherapy, and Moist heat  PLAN FOR NEXT SESSION: internal pelvic floor assessment; introduce pelvic floor AROM with breathing, review urge drill   Marni Sins, PT 10/04/2023, 4:50 PM

## 2023-10-11 ENCOUNTER — Ambulatory Visit: Admitting: Physical Therapy

## 2023-10-11 DIAGNOSIS — R293 Abnormal posture: Secondary | ICD-10-CM | POA: Diagnosis not present

## 2023-10-11 DIAGNOSIS — M6281 Muscle weakness (generalized): Secondary | ICD-10-CM | POA: Diagnosis not present

## 2023-10-11 DIAGNOSIS — R279 Unspecified lack of coordination: Secondary | ICD-10-CM

## 2023-10-11 NOTE — Patient Instructions (Signed)
About Abdominal Massage  Abdominal massage, also called external colon massage, is a self-treatment circular massage technique that can reduce and eliminate gas and ease constipation. The colon naturally contracts in waves in a clockwise direction starting from inside the right hip, moving up toward the ribs, across the belly, and down inside the left hip.  When you perform circular abdominal massage, you help stimulate your colon's normal wave pattern of movement called peristalsis.  It is most beneficial when done after eating.  Positioning You can practice abdominal massage with oil while lying down, or in the shower with soap.  Some people find that it is just as effective to do the massage through clothing while sitting or standing.  How to Massage Start by placing your finger tips or knuckles on your right side, just inside your hip bone.  . Make small circular movements while you move upward toward your rib cage.   . Once you reach the bottom right side of your rib cage, take your circular movements across to the left side of the bottom of your rib cage.  . Next, move downward until you reach the inside of your left hip bone.  This is the path your feces travel in your colon. . Continue to perform your abdominal massage in this pattern for 10 minutes each day.     You can apply as much pressure as is comfortable in your massage.  Start gently and build pressure as you continue to practice.  Notice any areas of pain as you massage; areas of slight pain may be relieved as you massage, but if you have areas of significant or intense pain, consult with your healthcare provider.  Other Considerations . General physical activity including bending and stretching can have a beneficial massage-like effect on the colon.  Deep breathing can also stimulate the colon because breathing deeply activates the same nervous system that supplies the colon.   . Abdominal massage should always be used in  combination with a bowel-conscious diet that is high in the proper type of fiber for you, fluids (primarily water), and a regular exercise program.  

## 2023-10-11 NOTE — Therapy (Signed)
 OUTPATIENT PHYSICAL THERAPY FEMALE PELVIC TREATMENT   Patient Name: Jordan Boyle MRN: [id number] DOB:[birth year], 73 y.o., female Today's Date: 10/11/2023  END OF SESSION:  PT End of Session - 10/11/23 0923     Visit Number 4    Number of Visits 8    Date for PT Re-Evaluation 10/20/23    Authorization Type Medicare/BCBS    PT Start Time 0845    PT Stop Time 0923    PT Time Calculation (min) 38 min    Activity Tolerance Patient tolerated treatment well    Behavior During Therapy [facility] for tasks assessed/performed                No past medical history on file. Past Surgical History:  Procedure Laterality Date   BREAST BIOPSY Left    There are no active problems to display for this patient.   PCP: Perley Bradley, MD   REFERRING PROVIDER: Perley Bradley, MD   REFERRING DIAG: R32 (ICD-10-CM) - Urine incontinence  THERAPY DIAG:  Muscle weakness (generalized)  Unspecified lack of coordination  Abnormal posture  Rationale for Evaluation and Treatment: Rehabilitation  ONSET DATE: 1 year ago   SUBJECTIVE:                                                                                                                                                                                           SUBJECTIVE STATEMENT: Patient reports that her urine leakage is consistently better - she has had a few dribbles since she's seen me last. If she is relaxed and she stands up, she will leak urine. Still getting up 1-2x night depending on what she drinks and how late she drinks it. She had some constipation this morning - but once she did her exercises, she was able to have a bowel movement.   From eval: Within the last year she has starting noticing increased urinary urgency and leakage with increased urgency when she stands up from a sitting position. When she is comfortable in the car and stands up, she will need to go. She will also notice this when pulling in the driveway. She  will occasionally have pain in the back side of her left hip.  Fluid intake: drinks 2 16.9 water bottles per day, green tea (2 cups per day), decaf coffee (1x/day), will occasionally drink juice (orange, cran-grape), will occasionally drink a mini coke or ginger ale   PAIN:  Are you having pain? No NPRS scale: 0/10  PRECAUTIONS: None  RED FLAGS: None   WEIGHT BEARING RESTRICTIONS: No  FALLS:  Has patient fallen in last 6 months? No  OCCUPATION:  retired in 2007  ACTIVITY LEVEL : sporadic physical activity, hasn't walked since the summer   PLOF: Independent  PATIENT GOALS: decreased urinary leakage, stop having to do the quick step to the toilet   PERTINENT HISTORY:  Hysterectomy, tubal ligation Sexual abuse: No  BOWEL MOVEMENT: Pain with bowel movement: No Type of bowel movement:Type (Bristol Stool Scale) 4, Frequency 1x/day, Strain depends, and Splinting   Fully empty rectum: No - sometimes has to go back and have a second bowel movement  Leakage: No Pads: No Fiber supplement/laxative No  URINATION: Pain with urination: No Fully empty bladder: Yes:   Stream: Strong Urgency: Yes  Frequency: she will go even when she has no urgency to ensure she won't leak on the way  Leakage: Urge to void, Walking to the bathroom, and Laughing Pads: Yes: only if she's going to be out for a long time or traveling   INTERCOURSE: yes   Ability to have vaginal penetration Yes  Pain with intercourse: Initial Penetration and After Intercourse DrynessYes  Climax: yes Marinoff Scale: 1/3 Uses lubricant   PREGNANCY: Vaginal deliveries 2 Tearing Yes: tearing but she doesn't remember  C-section deliveries 0  PROLAPSE: None  OBJECTIVE:  Note: Objective measures were completed at Evaluation unless otherwise noted.  PATIENT SURVEYS:  PFIQ-7: 10  COGNITION: Overall cognitive status: Within functional limits for tasks assessed     SENSATION: Light touch: Appears intact  LUMBAR  SPECIAL TESTS:  Single leg stance test: Positive  FUNCTIONAL TESTS:  Unable to get up from the floor to standing, able to perform STS with no pain   GAIT: Comments: moderate trendelenburg gait pattern with ambulation   POSTURE: rounded shoulders, forward head, and increased thoracic kyphosis   LUMBARAROM/PROM:  A/PROM A/PROM  eval  Flexion 25 % limited  Extension 50% limited  Right lateral flexion 25% limited  Left lateral flexion 25% limited  Right rotation Within normal limits   Left rotation Within normal limits    (Blank rows = not tested)  LOWER EXTREMITY ROM: within functional limits   Active ROM Right eval Left eval  Hip flexion    Hip extension    Hip abduction    Hip adduction    Hip internal rotation    Hip external rotation    Knee flexion    Knee extension    Ankle dorsiflexion    Ankle plantarflexion    Ankle inversion    Ankle eversion     (Blank rows = not tested)  LOWER EXTREMITY MMT: 4-/5 bilateral knees and hips grossly  MMT Right eval Left eval  Hip flexion    Hip extension    Hip abduction    Hip adduction    Hip internal rotation    Hip external rotation    Knee flexion    Knee extension    Ankle dorsiflexion    Ankle plantarflexion    Ankle inversion    Ankle eversion     (Blank rows = not tested) PALPATION:   General: lumbopelvic stiffness present, primarily left hip posteriorly, above iliac crest  Pelvic Alignment: within normal limits   Abdominal: upper chest breathing, abdominal bracing at rest                External Perineal Exam: deferred due to time, assess at next visit                             Internal  Pelvic Floor:  deferred due to time, assess at next visit  Patient confirms identification and approves PT to assess internal pelvic floor and treatment Yes No emotional/communication barriers or cognitive limitation. Patient is motivated to learn. Patient understands and agrees with treatment goals and plan. PT  explains patient will be examined in standing, sitting, and lying down to see how their muscles and joints work. When they are ready, they will be asked to remove their underwear so PT can examine their perineum. The patient is also given the option of providing their own chaperone as one is not provided in our facility. The patient also has the right and is explained the right to defer or refuse any part of the evaluation or treatment including the internal exam. With the patient's consent, PT will use one gloved finger to gently assess the muscles of the pelvic floor, seeing how well it contracts and relaxes and if there is muscle symmetry. After, the patient will get dressed and PT and patient will discuss exam findings and plan of care. PT and patient discuss plan of care, schedule, attendance policy and HEP activities.  PELVIC MMT: deferred due to time, assess at next visit   MMT eval  Vaginal 3/5 manual muscle test, 5 quick flicks, 3 second hold   Internal Anal Sphincter   External Anal Sphincter   Puborectalis   Diastasis Recti   (Blank rows = not tested)        TONE: Within normal limits   PROLAPSE: N/A   TODAY'S TREATMENT:                                                                                                                              DATE:   09/26/23: Neuro re-ed:  Urge drill: diaphragmatic breathing x3, pelvic floor quick flick contractions (glute squeeze present as well) to control urinary urgency when getting out of car, walking into house, and stepping into bathroom Diaphragmatic breathing + pelvic floor lengthening/shortening 2x10 Hooklying pelvic floor quick flicks + diaphragmatic breathing 2x10  Self care: Relative pelvic anatomy and pelvic floor muscle education, connection between diaphragm and pelvic floor, intraabdominal pressure management and the pelvic floor, how the pelvic floor controls urinary output and control  10/04/23: Neuro re-ed:  Hooklying  pelvic floor quick flicks + diaphragmatic breathing 2x10  Sidelying clamshell + diaphragmatic breathing 2x10  Bridge + adductor ball squeeze + diaphragmatic breathing 2x10  Sit to stand + diaphragmatic breathing 2x10  Hooklying diaphragmatic breathing + adductor ball squeeze 2x10  Self care: Relative pelvic anatomy and pelvic floor muscle education, connection between diaphragm and pelvic floor, intraabdominal pressure management and the pelvic floor, how the pelvic floor controls urinary output and control  10/11/23: Neuro re-ed:  Seated diaphragmatic breathing + pelvic floor lengthening with inhalation and shortening with exhalation  Sidelying clamshell + diaphragmatic breathing 2x10  Bridge + adductor ball squeeze + diaphragmatic breathing 2x10  Sit to stand + adductor ball  squeeze + diaphragmatic breathing 2x10  Seated marching with yellow band around knees + diaphragmatic breathing 2x10  Standing pull down + march alternating legs + diaphragmatic breathing 2x10 (RTB)  Self care: Relative pelvic anatomy and pelvic floor muscle education, connection between diaphragm and pelvic floor, intraabdominal pressure management and the pelvic floor, how the pelvic floor controls urinary output and control Abdominal massage education to relieve lower left quadrant pain when constipated  PATIENT EDUCATION:  Education details: Relative pelvic anatomy and pelvic floor muscle education, connection between diaphragm and pelvic floor, intraabdominal pressure management and the pelvic floor, how the pelvic floor controls urinary output and control Person educated: Patient Education method: Explanation, Demonstration, Tactile cues, Verbal cues, and Handouts Education comprehension: verbalized understanding, returned demonstration, verbal cues required, tactile cues required, and needs further education  HOME EXERCISE PROGRAM: Access Code: [email or web address] URL: [email or web address] Date:  10/04/2023 Prepared by: Robbin Chill  Exercises - Supine Pelvic Floor Contraction  - 1 x daily - 7 x weekly - 2 sets - 10 reps - Quick Flick Pelvic Floor Contractions in Hooklying  - 1 x daily - 7 x weekly - 2 sets - 10 reps - Sit to Stand Without Arm Support  - 1 x daily - 7 x weekly - 2 sets - 10 reps - Clamshell  - 1 x daily - 7 x weekly - 2 sets - 10 reps - Supine Bridge with Mini Swiss Ball Between Knees  - 1 x daily - 7 x weekly - 2 sets - 10 reps - Supine Hip Adduction Isometric with Ball  - 1 x daily - 7 x weekly - 2 sets - 10 reps  ASSESSMENT:  CLINICAL IMPRESSION: Patient is a 73 y.o. female  who was seen today for physical therapy treatment for urinary incontinence and urgency. Patient educated on abdominal massage for constipation management as she has been noticing lower left quadrant discomfort when constipated. Exercises progressed in gravitational load and resistance today. Max cueing required from PT for breathing cues and form cues. No increase in discomfort following today's session. Pt would benefit from additional PT to further address urinary urgency/leakage, pelvic floor control and coordination, and generalized lumbopelvic weakness.    OBJECTIVE IMPAIRMENTS: decreased coordination, decreased endurance, decreased ROM, and decreased strength.   ACTIVITY LIMITATIONS: continence  PARTICIPATION LIMITATIONS:  none  PERSONAL FACTORS: Age, Past/current experiences, and Time since onset of injury/illness/exacerbation are also affecting patient's functional outcome.   REHAB POTENTIAL: Good  CLINICAL DECISION MAKING: Stable/uncomplicated  EVALUATION COMPLEXITY: Low   GOALS: Goals reviewed with patient? Yes  SHORT TERM GOALS: Target date: 09/22/2023  Pt will be independent with HEP.  Baseline: Goal status: INITIAL  2.  Pt will be independent with the knack, urge suppression technique, and double voiding in order to improve bladder habits and decrease urinary  incontinence.   Baseline:  Goal status: INITIAL  3.  Pt will have 50% less urgency due to bladder retraining and strengthening to improve quality of life and leakage secondary to urgency. Baseline:  Goal status: INITIAL  LONG TERM GOALS: Target date:   Pt will be independent with advanced HEP.  Baseline:  Goal status: INITIAL  2.  Pt will report UIQ-7 score of < or = to 5 points to suggest decreased functional limitations secondary to urinary urgency and leakage to improve quality of life.   Baseline: 10 points Goal status: INITIAL  3.  Pt to demonstrate at least 4+/5 bil hip strength for improved pelvic stability and functional  squats without leakage.  Baseline:  Goal status: INITIAL  4.  Pt will be able to lift at least 10 lb correctly for 10 reps without pain or leakage for functional activity continence and to improve quality of life.  Baseline:  Goal status: INITIAL  PLAN:  PT FREQUENCY: 1x/week  PT DURATION: 8 weeks  PLANNED INTERVENTIONS: 97110-Therapeutic exercises, 97530- Therapeutic activity, 97112- Neuromuscular re-education, 97535- Self Care, [id number]- Manual therapy, Taping, Dry Needling, Joint mobilization, Spinal mobilization, Scar mobilization, Cryotherapy, and Moist heat  PLAN FOR NEXT SESSION: internal pelvic floor assessment; introduce pelvic floor AROM with breathing, review urge drill   Marni Sins, PT 10/11/2023, 9:23 AM

## 2023-10-18 ENCOUNTER — Ambulatory Visit: Admitting: Physical Therapy

## 2023-10-18 DIAGNOSIS — E785 Hyperlipidemia, unspecified: Secondary | ICD-10-CM | POA: Diagnosis not present

## 2023-10-18 DIAGNOSIS — N1831 Chronic kidney disease, stage 3a: Secondary | ICD-10-CM | POA: Diagnosis not present

## 2023-10-18 DIAGNOSIS — E663 Overweight: Secondary | ICD-10-CM | POA: Diagnosis not present

## 2023-10-18 DIAGNOSIS — I129 Hypertensive chronic kidney disease with stage 1 through stage 4 chronic kidney disease, or unspecified chronic kidney disease: Secondary | ICD-10-CM | POA: Diagnosis not present

## 2023-11-18 DIAGNOSIS — N1831 Chronic kidney disease, stage 3a: Secondary | ICD-10-CM | POA: Diagnosis not present

## 2023-11-18 DIAGNOSIS — I129 Hypertensive chronic kidney disease with stage 1 through stage 4 chronic kidney disease, or unspecified chronic kidney disease: Secondary | ICD-10-CM | POA: Diagnosis not present

## 2023-11-18 DIAGNOSIS — E785 Hyperlipidemia, unspecified: Secondary | ICD-10-CM | POA: Diagnosis not present

## 2023-11-18 DIAGNOSIS — E663 Overweight: Secondary | ICD-10-CM | POA: Diagnosis not present

## 2023-12-11 DIAGNOSIS — Z6829 Body mass index (BMI) 29.0-29.9, adult: Secondary | ICD-10-CM | POA: Diagnosis not present

## 2023-12-11 DIAGNOSIS — N1831 Chronic kidney disease, stage 3a: Secondary | ICD-10-CM | POA: Diagnosis not present

## 2023-12-11 DIAGNOSIS — E785 Hyperlipidemia, unspecified: Secondary | ICD-10-CM | POA: Diagnosis not present

## 2023-12-11 DIAGNOSIS — F03A0 Unspecified dementia, mild, without behavioral disturbance, psychotic disturbance, mood disturbance, and anxiety: Secondary | ICD-10-CM | POA: Diagnosis not present

## 2023-12-11 DIAGNOSIS — R7303 Prediabetes: Secondary | ICD-10-CM | POA: Diagnosis not present

## 2023-12-11 DIAGNOSIS — I129 Hypertensive chronic kidney disease with stage 1 through stage 4 chronic kidney disease, or unspecified chronic kidney disease: Secondary | ICD-10-CM | POA: Diagnosis not present

## 2023-12-11 DIAGNOSIS — E663 Overweight: Secondary | ICD-10-CM | POA: Diagnosis not present

## 2023-12-18 DIAGNOSIS — E785 Hyperlipidemia, unspecified: Secondary | ICD-10-CM | POA: Diagnosis not present

## 2023-12-18 DIAGNOSIS — E663 Overweight: Secondary | ICD-10-CM | POA: Diagnosis not present

## 2023-12-18 DIAGNOSIS — N1831 Chronic kidney disease, stage 3a: Secondary | ICD-10-CM | POA: Diagnosis not present

## 2023-12-18 DIAGNOSIS — I129 Hypertensive chronic kidney disease with stage 1 through stage 4 chronic kidney disease, or unspecified chronic kidney disease: Secondary | ICD-10-CM | POA: Diagnosis not present

## 2024-01-18 DIAGNOSIS — E663 Overweight: Secondary | ICD-10-CM | POA: Diagnosis not present

## 2024-01-18 DIAGNOSIS — N1831 Chronic kidney disease, stage 3a: Secondary | ICD-10-CM | POA: Diagnosis not present

## 2024-01-18 DIAGNOSIS — E785 Hyperlipidemia, unspecified: Secondary | ICD-10-CM | POA: Diagnosis not present

## 2024-01-18 DIAGNOSIS — I129 Hypertensive chronic kidney disease with stage 1 through stage 4 chronic kidney disease, or unspecified chronic kidney disease: Secondary | ICD-10-CM | POA: Diagnosis not present

## 2024-02-18 DIAGNOSIS — N1831 Chronic kidney disease, stage 3a: Secondary | ICD-10-CM | POA: Diagnosis not present

## 2024-02-18 DIAGNOSIS — I129 Hypertensive chronic kidney disease with stage 1 through stage 4 chronic kidney disease, or unspecified chronic kidney disease: Secondary | ICD-10-CM | POA: Diagnosis not present

## 2024-02-18 DIAGNOSIS — E663 Overweight: Secondary | ICD-10-CM | POA: Diagnosis not present

## 2024-02-18 DIAGNOSIS — E785 Hyperlipidemia, unspecified: Secondary | ICD-10-CM | POA: Diagnosis not present

## 2024-03-19 DIAGNOSIS — E785 Hyperlipidemia, unspecified: Secondary | ICD-10-CM | POA: Diagnosis not present

## 2024-03-19 DIAGNOSIS — N1831 Chronic kidney disease, stage 3a: Secondary | ICD-10-CM | POA: Diagnosis not present

## 2024-03-19 DIAGNOSIS — E663 Overweight: Secondary | ICD-10-CM | POA: Diagnosis not present

## 2024-03-19 DIAGNOSIS — I129 Hypertensive chronic kidney disease with stage 1 through stage 4 chronic kidney disease, or unspecified chronic kidney disease: Secondary | ICD-10-CM | POA: Diagnosis not present

## 2024-04-19 DIAGNOSIS — N1831 Chronic kidney disease, stage 3a: Secondary | ICD-10-CM | POA: Diagnosis not present

## 2024-04-19 DIAGNOSIS — E785 Hyperlipidemia, unspecified: Secondary | ICD-10-CM | POA: Diagnosis not present

## 2024-04-19 DIAGNOSIS — I129 Hypertensive chronic kidney disease with stage 1 through stage 4 chronic kidney disease, or unspecified chronic kidney disease: Secondary | ICD-10-CM | POA: Diagnosis not present

## 2024-04-19 DIAGNOSIS — E663 Overweight: Secondary | ICD-10-CM | POA: Diagnosis not present

## 2024-05-19 DIAGNOSIS — E663 Overweight: Secondary | ICD-10-CM | POA: Diagnosis not present

## 2024-05-19 DIAGNOSIS — N1831 Chronic kidney disease, stage 3a: Secondary | ICD-10-CM | POA: Diagnosis not present

## 2024-05-19 DIAGNOSIS — E785 Hyperlipidemia, unspecified: Secondary | ICD-10-CM | POA: Diagnosis not present

## 2024-05-19 DIAGNOSIS — I129 Hypertensive chronic kidney disease with stage 1 through stage 4 chronic kidney disease, or unspecified chronic kidney disease: Secondary | ICD-10-CM | POA: Diagnosis not present

## 2024-05-20 ENCOUNTER — Other Ambulatory Visit: Payer: Self-pay | Admitting: Family Medicine

## 2024-05-20 DIAGNOSIS — Z1231 Encounter for screening mammogram for malignant neoplasm of breast: Secondary | ICD-10-CM

## 2024-05-22 ENCOUNTER — Inpatient Hospital Stay: Admission: RE | Admit: 2024-05-22 | Discharge: 2024-05-22 | Attending: Family Medicine | Admitting: Family Medicine

## 2024-05-22 DIAGNOSIS — Z1231 Encounter for screening mammogram for malignant neoplasm of breast: Secondary | ICD-10-CM | POA: Diagnosis not present

## 2024-06-04 DIAGNOSIS — H25811 Combined forms of age-related cataract, right eye: Secondary | ICD-10-CM | POA: Diagnosis not present

## 2024-06-04 DIAGNOSIS — E119 Type 2 diabetes mellitus without complications: Secondary | ICD-10-CM | POA: Diagnosis not present

## 2024-06-04 DIAGNOSIS — H5213 Myopia, bilateral: Secondary | ICD-10-CM | POA: Diagnosis not present

## 2024-06-04 DIAGNOSIS — H52203 Unspecified astigmatism, bilateral: Secondary | ICD-10-CM | POA: Diagnosis not present
